# Patient Record
Sex: Female | Born: 1985 | Race: Black or African American | Hispanic: No | Marital: Married | Smoking: Never smoker
Health system: Southern US, Community
[De-identification: ages and names within clinical notes are randomized; demographics above are authoritative.]

## PROBLEM LIST (undated history)

## (undated) ENCOUNTER — Inpatient Hospital Stay (HOSPITAL_COMMUNITY): Payer: Self-pay

## (undated) DIAGNOSIS — R42 Dizziness and giddiness: Secondary | ICD-10-CM

## (undated) DIAGNOSIS — Z789 Other specified health status: Secondary | ICD-10-CM

## (undated) DIAGNOSIS — IMO0002 Reserved for concepts with insufficient information to code with codable children: Secondary | ICD-10-CM

## (undated) DIAGNOSIS — R87629 Unspecified abnormal cytological findings in specimens from vagina: Secondary | ICD-10-CM

## (undated) HISTORY — DX: Unspecified abnormal cytological findings in specimens from vagina: R87.629

## (undated) HISTORY — PX: COLPOSCOPY: SHX161

---

## 2007-07-04 ENCOUNTER — Ambulatory Visit (HOSPITAL_COMMUNITY): Admission: RE | Admit: 2007-07-04 | Discharge: 2007-07-04 | Payer: Self-pay | Admitting: Obstetrics & Gynecology

## 2007-07-08 ENCOUNTER — Ambulatory Visit (HOSPITAL_COMMUNITY): Admission: RE | Admit: 2007-07-08 | Discharge: 2007-07-08 | Payer: Self-pay | Admitting: Obstetrics & Gynecology

## 2007-07-22 ENCOUNTER — Ambulatory Visit (HOSPITAL_COMMUNITY): Admission: RE | Admit: 2007-07-22 | Discharge: 2007-07-22 | Payer: Self-pay | Admitting: Obstetrics & Gynecology

## 2007-08-21 ENCOUNTER — Ambulatory Visit (HOSPITAL_COMMUNITY): Admission: RE | Admit: 2007-08-21 | Discharge: 2007-08-21 | Payer: Self-pay | Admitting: Family Medicine

## 2007-10-04 ENCOUNTER — Ambulatory Visit: Payer: Self-pay | Admitting: Obstetrics and Gynecology

## 2007-10-04 ENCOUNTER — Inpatient Hospital Stay (HOSPITAL_COMMUNITY): Admission: AD | Admit: 2007-10-04 | Discharge: 2007-10-07 | Payer: Self-pay | Admitting: Obstetrics and Gynecology

## 2007-10-10 ENCOUNTER — Ambulatory Visit: Admission: RE | Admit: 2007-10-10 | Discharge: 2007-10-10 | Payer: Self-pay | Admitting: Family Medicine

## 2007-11-18 ENCOUNTER — Encounter: Admission: RE | Admit: 2007-11-18 | Discharge: 2007-11-18 | Payer: Self-pay | Admitting: Family Medicine

## 2009-07-20 ENCOUNTER — Ambulatory Visit (HOSPITAL_COMMUNITY): Admission: RE | Admit: 2009-07-20 | Discharge: 2009-07-20 | Payer: Self-pay | Admitting: Obstetrics & Gynecology

## 2009-07-30 DIAGNOSIS — R87619 Unspecified abnormal cytological findings in specimens from cervix uteri: Secondary | ICD-10-CM

## 2009-07-30 DIAGNOSIS — IMO0002 Reserved for concepts with insufficient information to code with codable children: Secondary | ICD-10-CM

## 2009-07-30 HISTORY — DX: Reserved for concepts with insufficient information to code with codable children: IMO0002

## 2009-07-30 HISTORY — DX: Unspecified abnormal cytological findings in specimens from cervix uteri: R87.619

## 2009-12-23 ENCOUNTER — Ambulatory Visit: Payer: Self-pay | Admitting: Obstetrics & Gynecology

## 2009-12-23 ENCOUNTER — Ambulatory Visit: Payer: Self-pay | Admitting: Physician Assistant

## 2009-12-23 ENCOUNTER — Inpatient Hospital Stay (HOSPITAL_COMMUNITY): Admission: AD | Admit: 2009-12-23 | Discharge: 2009-12-25 | Payer: Self-pay | Admitting: Family Medicine

## 2010-03-23 ENCOUNTER — Ambulatory Visit (HOSPITAL_COMMUNITY): Admission: RE | Admit: 2010-03-23 | Discharge: 2010-03-23 | Payer: Self-pay | Admitting: Cardiology

## 2010-10-16 LAB — CBC
HCT: 42 % (ref 36.0–46.0)
Hemoglobin: 13.6 g/dL (ref 12.0–15.0)
MCHC: 33.5 g/dL (ref 30.0–36.0)
MCHC: 33.7 g/dL (ref 30.0–36.0)
MCV: 91.7 fL (ref 78.0–100.0)
Platelets: 149 10*3/uL — ABNORMAL LOW (ref 150–400)
Platelets: 157 10*3/uL (ref 150–400)
RBC: 4.39 MIL/uL (ref 3.87–5.11)
RDW: 13.6 % (ref 11.5–15.5)
RDW: 13.7 % (ref 11.5–15.5)

## 2010-10-16 LAB — RH IMMUNE GLOB WKUP(>/=20WKS)(NOT WOMEN'S HOSP)

## 2010-10-16 LAB — RPR: RPR Ser Ql: NONREACTIVE

## 2011-04-23 LAB — RH IMMUNE GLOB WKUP(>/=20WKS)(NOT WOMEN'S HOSP): Fetal Screen: NEGATIVE

## 2011-04-23 LAB — CBC
HCT: 41.8
Hemoglobin: 14.3
MCHC: 34
MCHC: 34.3
Platelets: 183
RBC: 4.15
RBC: 4.67
RDW: 14
WBC: 12.7 — ABNORMAL HIGH
WBC: 8.7

## 2011-07-15 ENCOUNTER — Emergency Department (INDEPENDENT_AMBULATORY_CARE_PROVIDER_SITE_OTHER)
Admission: EM | Admit: 2011-07-15 | Discharge: 2011-07-15 | Disposition: A | Discharge status: Home / Self Care | Payer: Self-pay | Source: Home / Self Care | Attending: Family Medicine | Admitting: Family Medicine

## 2011-07-15 DIAGNOSIS — S0990XA Unspecified injury of head, initial encounter: Secondary | ICD-10-CM

## 2011-07-15 NOTE — ED Notes (Signed)
Pt was assisting pt to bathroom and the battery fell out of the hoyer lift and hit her in forehead at 1105 today,  Small eraser size spot on forehead.  Pain 1/1

## 2011-07-15 NOTE — ED Provider Notes (Signed)
History     CSN: [id number] Arrival date & time: 07/15/2011  2:40 PM   First MD Initiated Contact with Patient 07/15/11 1423      No chief complaint on file.   (Consider location/radiation/quality/duration/timing/severity/associated sxs/prior treatment) Patient is a 25 y.o. female presenting with head injury.  Head Injury  The incident occurred 3 to 5 hours ago. She came to the ER via walk-in. The injury mechanism was a direct blow (battery fell from hoyer lift and struck pt in forehead.). There was no loss of consciousness. There was no blood loss. The quality of the pain is described as dull. The pain is mild. Associated symptoms comments: none. She has tried nothing for the symptoms.    No past medical history on file.  No past surgical history on file.  No family history on file.  History  Substance Use Topics  . Smoking status: Not on file  . Smokeless tobacco: Not on file  . Alcohol Use: Not on file    OB History    No data available      Review of Systems  Constitutional: Negative.   Musculoskeletal: Negative.   Neurological: Negative.     Allergies  Review of patient's allergies indicates not on file.  Home Medications  No current outpatient prescriptions on file.  BP 101/56  Pulse 49  Temp(Src) 99 F (37.2 C) (Oral)  Resp 20  SpO2 99%  Physical Exam  Nursing note and vitals reviewed. Constitutional: She is oriented to person, place, and time. She appears well-developed and well-nourished.  HENT:  Head: Normocephalic. Head is with abrasion. Head is without raccoon's eyes, without contusion, without laceration, without right periorbital erythema and without left periorbital erythema.    Right Ear: External ear normal.  Left Ear: External ear normal.  Mouth/Throat: Oropharynx is clear and moist.  Eyes: Pupils are equal, round, and reactive to light.  Neck: Normal range of motion. Neck supple.  Neurological: She is alert and oriented to person,  place, and time.  Skin: Skin is warm and dry.    ED Course  Procedures (including critical care time)  Labs Reviewed - No data to display No results found.   1. Minor head injury       MDM          Barkley Bruns, MD 07/15/11 [phone]

## 2011-08-19 ENCOUNTER — Emergency Department (HOSPITAL_COMMUNITY)
Admission: EM | Admit: 2011-08-19 | Discharge: 2011-08-19 | Disposition: A | Discharge status: Home / Self Care | Payer: Medicaid Other | Attending: Emergency Medicine | Admitting: Emergency Medicine

## 2011-08-19 DIAGNOSIS — R000 Tachycardia, unspecified: Secondary | ICD-10-CM | POA: Insufficient documentation

## 2011-08-19 DIAGNOSIS — R112 Nausea with vomiting, unspecified: Secondary | ICD-10-CM | POA: Insufficient documentation

## 2011-08-19 DIAGNOSIS — R630 Anorexia: Secondary | ICD-10-CM | POA: Insufficient documentation

## 2011-08-19 DIAGNOSIS — J3489 Other specified disorders of nose and nasal sinuses: Secondary | ICD-10-CM | POA: Insufficient documentation

## 2011-08-19 DIAGNOSIS — R070 Pain in throat: Secondary | ICD-10-CM | POA: Insufficient documentation

## 2011-08-19 DIAGNOSIS — IMO0001 Reserved for inherently not codable concepts without codable children: Secondary | ICD-10-CM | POA: Insufficient documentation

## 2011-08-19 DIAGNOSIS — R509 Fever, unspecified: Secondary | ICD-10-CM | POA: Insufficient documentation

## 2011-08-19 DIAGNOSIS — R05 Cough: Secondary | ICD-10-CM | POA: Insufficient documentation

## 2011-08-19 DIAGNOSIS — R5381 Other malaise: Secondary | ICD-10-CM | POA: Insufficient documentation

## 2011-08-19 DIAGNOSIS — J111 Influenza due to unidentified influenza virus with other respiratory manifestations: Secondary | ICD-10-CM | POA: Insufficient documentation

## 2011-08-19 DIAGNOSIS — R059 Cough, unspecified: Secondary | ICD-10-CM | POA: Insufficient documentation

## 2011-08-19 DIAGNOSIS — H9209 Otalgia, unspecified ear: Secondary | ICD-10-CM | POA: Insufficient documentation

## 2011-08-19 MED ORDER — ONDANSETRON HCL 4 MG/2ML IJ SOLN
4.0000 mg | Freq: Once | INTRAMUSCULAR | Status: AC
  Administered 2011-08-19: 4 mg via INTRAVENOUS
  Filled 2011-08-19: qty 2

## 2011-08-19 MED ORDER — KETOROLAC TROMETHAMINE 30 MG/ML IJ SOLN
30.0000 mg | Freq: Once | INTRAMUSCULAR | Status: AC
  Administered 2011-08-19: 30 mg via INTRAVENOUS
  Filled 2011-08-19: qty 1

## 2011-08-19 MED ORDER — ONDANSETRON 8 MG PO TBDP: 8.0000 mg | ORAL_TABLET | Freq: Three times a day (TID) | ORAL | Status: AC | PRN

## 2011-08-19 MED ORDER — NAPROXEN SODIUM 220 MG PO TABS: ORAL_TABLET | ORAL | Status: DC

## 2011-08-19 MED ORDER — SODIUM CHLORIDE 0.9 % IV BOLUS (SEPSIS)
1000.0000 mL | Freq: Once | INTRAVENOUS | Status: AC
  Administered 2011-08-19: 1000 mL via INTRAVENOUS

## 2011-08-19 NOTE — ED Provider Notes (Signed)
History     CSN: [id number]  Arrival date & time 08/19/11  0449   First MD Initiated Contact with Patient 08/19/11 [phone]      Chief Complaint  Patient presents with  . Flu-like symptoms     (Consider location/radiation/quality/duration/timing/severity/associated sxs/prior treatment) HPI Is a 26 year old female with three-day history of fever, chills, myalgias, nausea, vomiting, cough, nasal congestion, sore throat, earache and malaise. She's had multiple family members and contacts with similar symptoms. She was not able to keeping her stomach yesterday but has been drinking water this morning without difficulty. She has not had diarrhea, dyspnea or abdominal pain. She's been taking over-the-counter cold medications without significant relief. She called EMS this morning because she felt worse. The symptoms are described as moderate to severe.  No past medical history on file.  No past surgical history on file.  No family history on file.  History  Substance Use Topics  . Smoking status: Never Smoker   . Smokeless tobacco: Not on file  . Alcohol Use: No    OB History    Grav Para Term Preterm Abortions TAB SAB Ect Mult Living                  Review of Systems  All other systems reviewed and are negative.    Allergies  Review of patient's allergies indicates no known allergies.  Home Medications  No current outpatient prescriptions on file.  BP 126/64  Pulse 110  Temp(Src) 100.3 F (37.9 C) (Oral)  Resp 20  SpO2 100%  Physical Exam General: Well-developed, well-nourished female in no acute distress; appearance consistent with age of record HENT: normocephalic, atraumatic; no pharyngeal erythema; slight pharyngeal edema Eyes: pupils equal round and reactive to light; extraocular muscles intact Neck: supple Heart: regular rate and rhythm; tachycardia Lungs: clear to auscultation bilaterally Abdomen: soft; nondistended; nontender; no masses or  hepatosplenomegaly; bowel sounds present Extremities: No deformity; full range of motion; pulses normal Neurologic: Awake, alert and oriented; motor function intact in all extremities and symmetric; no facial droop Skin: Warm and dry     ED Course  Procedures (including critical care time)     MDM  Patient feels significantly better after IV fluid bolus, IV Toradol and IV Zofran. She is drinking fluids without emesis.          Hanley Seamen, MD 08/19/11 [phone]

## 2011-08-19 NOTE — ED Notes (Signed)
Pt ambulated with a steady gait; VSS; no signs of distress; skin warm and dry; respirations even and unlabored; no questions at this time.

## 2011-08-19 NOTE — ED Notes (Signed)
PT reports feeling a lot better; drinking apple juice at this time.

## 2011-08-19 NOTE — ED Notes (Signed)
Pt reports nasal congestion and headache and earache.

## 2011-08-19 NOTE — ED Notes (Signed)
Flu like symptoms

## 2012-04-17 ENCOUNTER — Encounter (HOSPITAL_COMMUNITY): Payer: Self-pay | Admitting: *Deleted

## 2012-04-17 ENCOUNTER — Inpatient Hospital Stay (HOSPITAL_COMMUNITY)
Admission: AD | Admit: 2012-04-17 | Discharge: 2012-04-17 | Disposition: A | Discharge status: Home / Self Care | Payer: Medicaid Other | Source: Ambulatory Visit | Attending: Obstetrics and Gynecology | Admitting: Obstetrics and Gynecology

## 2012-04-17 DIAGNOSIS — R142 Eructation: Secondary | ICD-10-CM | POA: Insufficient documentation

## 2012-04-17 DIAGNOSIS — R141 Gas pain: Secondary | ICD-10-CM

## 2012-04-17 DIAGNOSIS — R143 Flatulence: Secondary | ICD-10-CM | POA: Insufficient documentation

## 2012-04-17 DIAGNOSIS — R109 Unspecified abdominal pain: Secondary | ICD-10-CM | POA: Insufficient documentation

## 2012-04-17 DIAGNOSIS — Z3490 Encounter for supervision of normal pregnancy, unspecified, unspecified trimester: Secondary | ICD-10-CM

## 2012-04-17 DIAGNOSIS — O99891 Other specified diseases and conditions complicating pregnancy: Secondary | ICD-10-CM | POA: Insufficient documentation

## 2012-04-17 HISTORY — DX: Reserved for concepts with insufficient information to code with codable children: IMO0002

## 2012-04-17 LAB — URINE MICROSCOPIC-ADD ON

## 2012-04-17 LAB — URINALYSIS, ROUTINE W REFLEX MICROSCOPIC
Nitrite: NEGATIVE
Urobilinogen, UA: 0.2 mg/dL (ref 0.0–1.0)

## 2012-04-17 LAB — POCT PREGNANCY, URINE: Preg Test, Ur: POSITIVE — AB

## 2012-04-17 MED ORDER — PRENATAL VITAMINS 0.8 MG PO TABS: 1.0000 | ORAL_TABLET | Freq: Every day | ORAL | Status: DC

## 2012-04-17 NOTE — MAU Note (Signed)
Left sided abd pain started at 1845-is less now than it was earlier

## 2012-04-17 NOTE — MAU Provider Note (Signed)
Chief Complaint:  Abdominal Pain   Erika Decker is  26 y.o. [id number].  Patient's last menstrual period was 01/30/2012.Marland Kitchen  Her pregnancy status is positive. [redacted]w[redacted]d  She presents complaining of Abdominal Pain  Pt reports single episode of sharp LUQ abd pain that lasted for ~ 20 mins. Resolved spontaneously. No complaints at presents. States no PNC. First appt with [facility] on 05/07/12.  Obstetrical/Gynecological History: OB History    Grav Para Term Preterm Abortions TAB SAB Ect Mult Living   3 2 2       2       Past Medical History: Past Medical History  Diagnosis Date  . Abnormal Pap smear     Past Surgical History: Past Surgical History  Procedure Date  . No past surgeries     Family History: History reviewed. No pertinent family history.  Social History: History  Substance Use Topics  . Smoking status: Never Smoker   . Smokeless tobacco: Never Used  . Alcohol Use: No    Allergies: No Known Allergies  No prescriptions prior to admission    Review of Systems - History obtained from the patient General ROS: negative for - chills, fatigue or fever Endocrine ROS: negative Breast ROS: positive tenderness Respiratory ROS: no cough, shortness of breath, or wheezing Cardiovascular ROS: no chest pain or dyspnea on exertion Gastrointestinal ROS: no abdominal pain, change in bowel habits, or black or bloody stools Genito-Urinary ROS: no dysuria, trouble voiding, or hematuria Musculoskeletal ROS: negative  Physical Exam   Blood pressure 109/66, pulse 81, temperature 97.3 F (36.3 C), temperature source Oral, resp. rate 20, height 5\' 2"  (1.575 m), weight 121 lb (54.885 kg), last menstrual period 01/30/2012, SpO2 100.00%.  General: General appearance - alert, well appearing, and in no distress, oriented to person, place, and time and normal appearing weight Abdomen - soft, nontender, nondistended, no masses or organomegaly no rebound tenderness noted bowel sounds normal no  bladder distension noted no CVA tenderness no hernias noted Focused Gynecological Exam: examination not indicated  Labs: Recent Results (from the past 24 hour(s))  POCT PREGNANCY, URINE   Collection Time   04/17/12  8:40 PM      Component Value Range   Preg Test, Ur POSITIVE (*) NEGATIVE   Imaging Studies:  Informal bedside: Viable IUP. +yolk sac, CRL measuring [redacted]w[redacted]d. + cardiac activty   Assessment: Gas Pain Viable IUP  Plan: Discharge home FU with [facility] as scheduled  Sarh Kirschenbaum E. 04/17/2012,9:08 PM

## 2012-04-18 NOTE — MAU Provider Note (Signed)
Attestation of Attending Supervision of Advanced Practitioner (CNM/NP): Evaluation and management procedures were performed by the Advanced Practitioner under my supervision and collaboration.  I have reviewed the Advanced Practitioner's note and chart, and I agree with the management and plan.  Bleu Minerd 04/18/2012 6:09 AM

## 2012-05-14 ENCOUNTER — Encounter (HOSPITAL_COMMUNITY): Payer: Self-pay | Admitting: *Deleted

## 2012-05-14 ENCOUNTER — Inpatient Hospital Stay (HOSPITAL_COMMUNITY)
Admission: AD | Admit: 2012-05-14 | Discharge: 2012-05-14 | Disposition: A | Discharge status: Home / Self Care | Payer: Medicaid Other | Source: Ambulatory Visit | Attending: Obstetrics & Gynecology | Admitting: Obstetrics & Gynecology

## 2012-05-14 DIAGNOSIS — O99891 Other specified diseases and conditions complicating pregnancy: Secondary | ICD-10-CM | POA: Insufficient documentation

## 2012-05-14 DIAGNOSIS — R0781 Pleurodynia: Secondary | ICD-10-CM

## 2012-05-14 DIAGNOSIS — R079 Chest pain, unspecified: Secondary | ICD-10-CM | POA: Insufficient documentation

## 2012-05-14 LAB — URINALYSIS, ROUTINE W REFLEX MICROSCOPIC
Bilirubin Urine: NEGATIVE
Glucose, UA: NEGATIVE mg/dL
Nitrite: NEGATIVE
Specific Gravity, Urine: 1.01 (ref 1.005–1.030)

## 2012-05-14 MED ORDER — ACETAMINOPHEN 500 MG PO TABS
1000.0000 mg | ORAL_TABLET | Freq: Once | ORAL | Status: AC
  Administered 2012-05-14: 1000 mg via ORAL
  Filled 2012-05-14: qty 2

## 2012-05-14 NOTE — MAU Provider Note (Signed)
  History     CSN: [id number]  Arrival date and time: 05/14/12 1220   First Provider Initiated Contact with Patient 05/14/12 1554      Chief Complaint  Patient presents with  . Chest Pain   HPI Erika Decker 26 y.o. [redacted]w[redacted]d  Comes to MAU with pain in her ribs when she moves or yawns.  Has not taken any medication.  Has not yet started prenatal care as she is awaiting Medicaid card.  No vomiting, no nausea, no vaginal bleeding, no vaginal discharge, no itching or burning.  OB History    Grav Para Term Preterm Abortions TAB SAB Ect Mult Living   3 2 2       2       Past Medical History  Diagnosis Date  . Abnormal Pap smear     Past Surgical History  Procedure Date  . No past surgeries     Family History  Problem Relation Age of Onset  . Diabetes Mother   . Hypertension Mother   . Mental illness Brother   . Asthma Brother     History  Substance Use Topics  . Smoking status: Never Smoker   . Smokeless tobacco: Never Used  . Alcohol Use: No    Allergies: No Known Allergies  Prescriptions prior to admission  Medication Sig Dispense Refill  . Prenatal Multivit-Min-Fe-FA (PRENATAL VITAMINS) 0.8 MG tablet Take 1 tablet by mouth daily.  30 tablet  10    Review of Systems  Constitutional: Negative for fever.  HENT: Negative for congestion.   Respiratory: Negative for cough and sputum production.   Cardiovascular:       Pain in ribs when moving  Gastrointestinal: Negative for nausea, vomiting, abdominal pain, diarrhea and constipation.  Genitourinary: Negative for dysuria.   Physical Exam   Blood pressure 108/62, pulse 84, temperature 98.7 F (37.1 C), temperature source Oral, resp. rate 18, height 5\' 1"  (1.549 m), weight 58.242 kg (128 lb 6.4 oz), last menstrual period 01/30/2012, SpO2 100.00%.  Physical Exam  Nursing note and vitals reviewed. Constitutional: She is oriented to person, place, and time. She appears well-developed and well-nourished.  HENT:    Head: Normocephalic.  Eyes: EOM are normal.  Neck: Neck supple.  Respiratory:       Pain with palpation in lower rib cage interiorly.  No pain in her sides or back.  GI: Soft. There is no tenderness.       FHT heard with doppler  Musculoskeletal: Normal range of motion.  Neurological: She is alert and oriented to person, place, and time.  Skin: Skin is warm and dry.  Psychiatric: She has a normal mood and affect.    MAU Course  Procedures  MDM Discussed possible causes of rib pain - changes in pregnancy or costochondiritis.  Advised tylenol PO for pain.  Advised it may take time to resolve or it may simply be a discomfort of pregnancy.  Assessment and Plan  Rib pain in pregnancy  Plan Take Tylenol 325 mg 2 tablets by mouth every 4 hours if needed for pain. Begin prenatal care as soon as possible.  BURLESON,TERRI 05/14/2012, 4:16 PM

## 2012-05-14 NOTE — MAU Note (Signed)
Pt states she started having pain last night. Pt describes pain as sharp pain "hurts every time I move"

## 2012-05-14 NOTE — MAU Note (Signed)
Patient states she started having rib pain that hurts with any type of movement. No cold symptoms or cough. Patient has not had prenatal care. Denies any bleeding or discharge.

## 2012-05-16 NOTE — MAU Provider Note (Signed)
Attestation of Attending Supervision of Advanced Practitioner (CNM/NP): Evaluation and management procedures were performed by the Advanced Practitioner under my supervision and collaboration.  I have reviewed the Advanced Practitioner's note and chart, and I agree with the management and plan.  [name], [name] 12:48 PM

## 2012-05-26 ENCOUNTER — Telehealth: Payer: Self-pay | Admitting: *Deleted

## 2012-05-26 NOTE — Telephone Encounter (Signed)
[employer], states she is having cold like symptoms like stuffy nose, sore throat. Discussed she may take Robitussin plain or DM, tylenol allergy or Delsym. She states she had a problem with her medicaid card and is getting that straightened out then will get prenatal care- states no one will take her until it is straightened out.

## 2012-05-26 NOTE — Telephone Encounter (Signed)
Erika Decker called and left a message stating she is calling to see what cough medicine she can take for a cold. States she is 4 months pregnant .( was seen in MAU, no prenatal appt. Noted)

## 2012-06-06 LAB — OB RESULTS CONSOLE GC/CHLAMYDIA: Chlamydia: NEGATIVE

## 2012-06-06 LAB — OB RESULTS CONSOLE RUBELLA ANTIBODY, IGM: Rubella: UNDETERMINED

## 2012-06-06 LAB — OB RESULTS CONSOLE HEPATITIS B SURFACE ANTIGEN: Hepatitis B Surface Ag: NEGATIVE

## 2012-07-25 ENCOUNTER — Encounter (HOSPITAL_COMMUNITY): Payer: Self-pay | Admitting: Obstetrics and Gynecology

## 2012-07-25 ENCOUNTER — Inpatient Hospital Stay (HOSPITAL_COMMUNITY)
Admission: AD | Admit: 2012-07-25 | Discharge: 2012-07-25 | Disposition: A | Discharge status: Home / Self Care | Payer: PRIVATE HEALTH INSURANCE | Source: Ambulatory Visit | Attending: Obstetrics & Gynecology | Admitting: Obstetrics & Gynecology

## 2012-07-25 DIAGNOSIS — O212 Late vomiting of pregnancy: Secondary | ICD-10-CM | POA: Insufficient documentation

## 2012-07-25 DIAGNOSIS — O99891 Other specified diseases and conditions complicating pregnancy: Secondary | ICD-10-CM

## 2012-07-25 DIAGNOSIS — R109 Unspecified abdominal pain: Secondary | ICD-10-CM | POA: Insufficient documentation

## 2012-07-25 DIAGNOSIS — K5289 Other specified noninfective gastroenteritis and colitis: Secondary | ICD-10-CM

## 2012-07-25 DIAGNOSIS — K529 Noninfective gastroenteritis and colitis, unspecified: Secondary | ICD-10-CM

## 2012-07-25 LAB — URINALYSIS, ROUTINE W REFLEX MICROSCOPIC
Bilirubin Urine: NEGATIVE
Nitrite: NEGATIVE
Specific Gravity, Urine: >1.03 — ABNORMAL HIGH (ref 1.005–1.030)
pH: 6 (ref 5.0–8.0)

## 2012-07-25 MED ORDER — ONDANSETRON 8 MG PO TBDP: 8.0000 mg | ORAL_TABLET | Freq: Three times a day (TID) | ORAL | Status: DC | PRN

## 2012-07-25 MED ORDER — ONDANSETRON HCL 4 MG PO TABS
8.0000 mg | ORAL_TABLET | Freq: Once | ORAL | Status: AC
  Administered 2012-07-25: 8 mg via ORAL
  Filled 2012-07-25: qty 2

## 2012-07-25 NOTE — MAU Provider Note (Signed)
  History     CSN: [id number]  Arrival date and time: 07/25/12 1231   None     Chief Complaint  Patient presents with  . Nausea  . Emesis   HPI  Pt is a G3P2002 at 25.2 wks IUP here with report of vomiting x one last night.  No vomiting reported today or diarrhea.  Denies fever, body aches or chills.  Recent exposure to son who had upper respiratory infection.  No report of vaginal bleeding or leaking of fluid.  Decreased eating and drinking today due to feeling "sick to my stomach".    Past Medical History  Diagnosis Date  . Abnormal Pap smear 2011    Colpo > paps nml after    Past Surgical History  Procedure Date  . No past surgeries     Family History  Problem Relation Age of Onset  . Diabetes Mother   . Hypertension Mother   . Mental illness Brother   . Asthma Brother     History  Substance Use Topics  . Smoking status: Never Smoker   . Smokeless tobacco: Never Used  . Alcohol Use: No    Allergies: No Known Allergies  Prescriptions prior to admission  Medication Sig Dispense Refill  . Prenatal Multivit-Min-Fe-FA (PRENATAL VITAMINS) 0.8 MG tablet Take 1 tablet by mouth daily.  30 tablet  10    Review of Systems  Gastrointestinal: Positive for nausea. Negative for vomiting and diarrhea.  All other systems reviewed and are negative.   Physical Exam   Blood pressure 106/53, pulse 116, temperature 98.7 F (37.1 C), resp. rate 18, height 5\' 1"  (1.549 m), weight 62.256 kg (137 lb 4 oz), last menstrual period 01/30/2012.  Physical Exam  Constitutional: She is oriented to person, place, and time. She appears well-developed and well-nourished. No distress.  HENT:  Head: Normocephalic.  Mouth/Throat: Mucous membranes are normal. Mucous membranes are not dry.  Neck: Normal range of motion. Neck supple.  Cardiovascular: Normal rate, regular rhythm and normal heart sounds.   Respiratory: Effort normal and breath sounds normal.  GI: Soft. There is no  tenderness.       Hyperactive bowel sounds  Genitourinary: No bleeding around the vagina.  Musculoskeletal: Normal range of motion. She exhibits no edema.  Neurological: She is alert and oriented to person, place, and time.  Skin: Skin is warm and dry.   Cervix - closed MAU Course  Procedures Results for orders placed during the hospital encounter of 07/25/12 (from the past 24 hour(s))  URINALYSIS, ROUTINE W REFLEX MICROSCOPIC     Status: Abnormal   Collection Time   07/25/12  1:01 PM      Component Value Range   Color, Urine YELLOW  YELLOW   APPearance CLEAR  CLEAR   Specific Gravity, Urine >1.030 (*) 1.005 - 1.030   pH 6.0  5.0 - 8.0   Glucose, UA NEGATIVE  NEGATIVE mg/dL   Hgb urine dipstick NEGATIVE  NEGATIVE   Bilirubin Urine NEGATIVE  NEGATIVE   Ketones, ur >80 (*) NEGATIVE mg/dL   Protein, ur NEGATIVE  NEGATIVE mg/dL   Urobilinogen, UA 0.2  0.0 - 1.0 mg/dL   Nitrite NEGATIVE  NEGATIVE   Leukocytes, UA NEGATIVE  NEGATIVE  FHR 140's, +accels (10x10), reactive Toco irritability  Zofran 8 mg PO Tolerated PO fluids without difficulty  Uterine irritability resolves  Assessment and Plan  Gastroenteritis  Plan: DC to home Encourage PO hydration  [facility] 07/25/2012, 1:31 PM

## 2012-07-25 NOTE — MAU Note (Signed)
Water and juice given to patient. Fluids encouraged.

## 2012-07-25 NOTE — MAU Note (Signed)
Pt presents to MAU with chief complaint of abdominal cramping. Pt says around 9:00 pm last night she felt her abdomen start to cramp. She vomited 1 time and has had NORMAL stool since then. Pt says she feels hot/cold on and off. Denies fever. Denies vaginal bleeding. Pt is [redacted]w[redacted]d

## 2012-07-30 NOTE — L&D Delivery Note (Signed)
Delivery Note At 2:01 PM a viable female was delivered via Vaginal, Spontaneous Delivery (Presentation: ; Occiput Anterior).  APGAR: 8, 9; weight .   Placenta status: Intact, Spontaneous.  Cord: 3 vessels with the following complications: None.    Anesthesia: None  Episiotomy: None Lacerations: 2nd degree;Perineal Suture Repair: 2.0 vicryl rapide Est. Blood Loss (mL): 200 ml  Mom to postpartum.  Baby to nursery-stable.  JACKSON-MOORE,Erubiel Manasco A 11/09/2012, 2:27 PM

## 2012-08-07 MED ORDER — RHO D IMMUNE GLOBULIN 1500 UNIT/2ML IJ SOLN: 300.0000 ug | Freq: Once | INTRAMUSCULAR | Status: DC

## 2012-08-15 ENCOUNTER — Inpatient Hospital Stay (HOSPITAL_COMMUNITY)
Admission: AD | Admit: 2012-08-15 | Discharge: 2012-08-15 | Disposition: A | Discharge status: Home / Self Care | Payer: Medicaid Other | Source: Ambulatory Visit | Attending: Obstetrics & Gynecology | Admitting: Obstetrics & Gynecology

## 2012-08-15 DIAGNOSIS — Z2989 Encounter for other specified prophylactic measures: Secondary | ICD-10-CM | POA: Insufficient documentation

## 2012-08-15 DIAGNOSIS — Z298 Encounter for other specified prophylactic measures: Secondary | ICD-10-CM | POA: Insufficient documentation

## 2012-08-15 LAB — ABO/RH: ABO/RH(D): A NEG

## 2012-08-15 MED ORDER — RHO D IMMUNE GLOBULIN 1500 UNIT/2ML IJ SOLN
300.0000 ug | INTRAMUSCULAR | Status: AC
  Administered 2012-08-15: 300 ug via INTRAMUSCULAR
  Filled 2012-08-15: qty 2

## 2012-08-16 LAB — RH IG WORKUP (INCLUDES ABO/RH)
ABO/RH(D): A NEG
Antibody Screen: NEGATIVE
Gestational Age(Wks): 28

## 2012-09-11 ENCOUNTER — Encounter (HOSPITAL_COMMUNITY): Payer: Self-pay | Admitting: *Deleted

## 2012-09-11 ENCOUNTER — Inpatient Hospital Stay (HOSPITAL_COMMUNITY)
Admission: AD | Admit: 2012-09-11 | Discharge: 2012-09-11 | Disposition: A | Discharge status: Home / Self Care | Payer: Medicaid Other | Source: Ambulatory Visit | Attending: Obstetrics | Admitting: Obstetrics

## 2012-09-11 DIAGNOSIS — O4700 False labor before 37 completed weeks of gestation, unspecified trimester: Secondary | ICD-10-CM | POA: Insufficient documentation

## 2012-09-11 DIAGNOSIS — K5289 Other specified noninfective gastroenteritis and colitis: Secondary | ICD-10-CM | POA: Insufficient documentation

## 2012-09-11 DIAGNOSIS — O99891 Other specified diseases and conditions complicating pregnancy: Secondary | ICD-10-CM | POA: Insufficient documentation

## 2012-09-11 DIAGNOSIS — K529 Noninfective gastroenteritis and colitis, unspecified: Secondary | ICD-10-CM

## 2012-09-11 HISTORY — DX: Dizziness and giddiness: R42

## 2012-09-11 LAB — URINE MICROSCOPIC-ADD ON

## 2012-09-11 LAB — URINALYSIS, ROUTINE W REFLEX MICROSCOPIC
Bilirubin Urine: NEGATIVE
Nitrite: NEGATIVE
Specific Gravity, Urine: >1.03 — ABNORMAL HIGH (ref 1.005–1.030)
Urobilinogen, UA: 1 mg/dL (ref 0.0–1.0)
pH: 6 (ref 5.0–8.0)

## 2012-09-11 LAB — WET PREP, GENITAL

## 2012-09-11 MED ORDER — ONDANSETRON 8 MG/NS 50 ML IVPB
8.0000 mg | Freq: Once | INTRAVENOUS | Status: AC
  Administered 2012-09-11: 8 mg via INTRAVENOUS
  Filled 2012-09-11: qty 8

## 2012-09-11 MED ORDER — LACTATED RINGERS IV BOLUS (SEPSIS)
1000.0000 mL | Freq: Once | INTRAVENOUS | Status: AC
  Administered 2012-09-11: 1000 mL via INTRAVENOUS

## 2012-09-11 MED ORDER — ONDANSETRON HCL 4 MG PO TABS: 4.0000 mg | ORAL_TABLET | Freq: Every day | ORAL | Status: DC | PRN

## 2012-09-11 MED ORDER — BETAMETHASONE SOD PHOS & ACET 6 (3-3) MG/ML IJ SUSP
12.0000 mg | Freq: Once | INTRAMUSCULAR | Status: AC
  Administered 2012-09-11: 12 mg via INTRAMUSCULAR
  Filled 2012-09-11: qty 2

## 2012-09-11 NOTE — MAU Note (Addendum)
Arrived via EMS. States has been having contractions since yesterday and came in today because UC's have continued. States she had N/V yesterday with more BM's than usual. Thought she may have "that virus." States she feels a little dizzy with some nausea today, but has been able to drink a lot of water. Denies fever or chills.

## 2012-09-11 NOTE — MAU Provider Note (Signed)
Chief Complaint:  Contractions  First Provider Initiated Contact with Patient 09/11/12 1209     HPI: Erika Decker is a 27 y.o. G3P2002 at [redacted]w[redacted]d who presents to maternity admissions by EMS reporting contractions, N/V and loose stools since yesterday. Able to keep down fluids. Vomited ~3 times. Last IC ~ 1 week ago.   Past Medical History: Past Medical History  Diagnosis Date  . Abnormal Pap smear 2011    Colpo > paps nml after  . Dizziness     Past obstetric history: OB History   Grav Para Term Preterm Abortions TAB SAB Ect Mult Living   3 2 2       2      # Outc Date GA Lbr Len/2nd Wgt Sex Del Anes PTL Lv   1 TRM 3/09 [redacted]w[redacted]d  4.540JW(1XB1YN) M SVD EPI  Yes   Comments: No complications   2 TRM 5/11 [redacted]w[redacted]d  8.295AO(1HY8MV) F SVD None  Yes   Comments: No complications   3 CUR               Past Surgical History: Past Surgical History  Procedure Laterality Date  . Colposcopy      Family History: Family History  Problem Relation Age of Onset  . Diabetes Mother   . Hypertension Mother   . Mental illness Brother   . Asthma Brother     Social History: History  Substance Use Topics  . Smoking status: Never Smoker   . Smokeless tobacco: Never Used  . Alcohol Use: No    Allergies: No Known Allergies  Meds:  Prescriptions prior to admission  Medication Sig Dispense Refill  . Prenatal Vit-Fe Fumarate-FA (PRENATAL MULTIVITAMIN) TABS Take 1 tablet by mouth daily.        ROS: Denies fever, chills, VB, LOF, sick contacts,   Physical Exam  Blood pressure 108/56, pulse 121, temperature 98.1 F (36.7 C), temperature source Oral, resp. rate 20, height 5\' 1"  (1.549 m), weight 65.772 kg (145 lb), last menstrual period 01/30/2012, SpO2 99.00%. Patient Vitals for the past 24 hrs:  BP Temp Temp src Pulse Resp SpO2 Height Weight  09/11/12 1537 108/56 mmHg - - 121 - - - -  09/11/12 1351 114/70 mmHg - - 135 - - - -  09/11/12 1226 112/62 mmHg - - 116 - - - -  09/11/12 1202 - - -  126 - 99 % - -  09/11/12 1157 - - - 110 - 98 % - -  09/11/12 1152 - - - 139 - 99 % - -  09/11/12 1151 103/58 mmHg 98.1 F (36.7 C) Oral 133 20 - 5\' 1"  (1.549 m) 65.772 kg (145 lb)    GENERAL: Well-developed, well-nourished female in no acute distress.  HEENT: normocephalic HEART: normal rate RESP: normal effort ABDOMEN: Soft, non-tender, gravid appropriate for gestational age EXTREMITIES: Nontender, no edema NEURO: alert and oriented SPECULUM EXAM: NEFG, physiologic discharge, no blood, cervix clean Dilation: 2 Effacement (%): Thick Cervical Position: Posterior Station: -2 Presentation: Vertex Exam by:: Ivonne Andrew, CNM  FHT:  Baseline 130 , moderate variability, accelerations present, no decelerations Contractions: q 9-12 mins, mild   Labs: Results for orders placed during the hospital encounter of 09/11/12 (from the past 24 hour(s))  URINALYSIS, ROUTINE W REFLEX MICROSCOPIC     Status: Abnormal   Collection Time    09/11/12 11:50 AM      Result Value Range   Color, Urine YELLOW  YELLOW   APPearance HAZY (*)  CLEAR   Specific Gravity, Urine >1.030 (*) 1.005 - 1.030   pH 6.0  5.0 - 8.0   Glucose, UA NEGATIVE  NEGATIVE mg/dL   Hgb urine dipstick NEGATIVE  NEGATIVE   Bilirubin Urine NEGATIVE  NEGATIVE   Ketones, ur >80 (*) NEGATIVE mg/dL   Protein, ur NEGATIVE  NEGATIVE mg/dL   Urobilinogen, UA 1.0  0.0 - 1.0 mg/dL   Nitrite NEGATIVE  NEGATIVE   Leukocytes, UA TRACE (*) NEGATIVE  URINE MICROSCOPIC-ADD ON     Status: Abnormal   Collection Time    09/11/12 11:50 AM      Result Value Range   Squamous Epithelial / LPF MANY (*) RARE   WBC, UA 3-6  <3 WBC/hpf   Bacteria, UA RARE  RARE   Urine-Other MUCOUS PRESENT    FETAL FIBRONECTIN     Status: Abnormal   Collection Time    09/11/12 12:39 PM      Result Value Range   Fetal Fibronectin POSITIVE (*) NEGATIVE  WET PREP, GENITAL     Status: Abnormal   Collection Time    09/11/12  1:45 PM      Result Value Range   Yeast  Wet Prep HPF POC NONE SEEN  NONE SEEN   Trich, Wet Prep NONE SEEN  NONE SEEN   Clue Cells Wet Prep HPF POC NONE SEEN  NONE SEEN   WBC, Wet Prep HPF POC FEW (*) NONE SEEN   Imaging:  No results found. MAU Course: BMZ # 1 given. IV fluids, Zofran. Tolerating PO's. Contractions resolved.   Assessment: 1. Preterm labor without delivery in third trimester   2. Gastroenteritis, acute    Plan: Discharge home Preterm labor precautions and fetal kick counts. Pelvic rest x 1 week.  Increase rest and fluids.  Advance diet slowly.     Follow-up Information   Schedule an appointment as soon as possible for a visit with HARPER,CHARLES A, MD.   Contact information:   [address] [place] [address] [phone]       Follow up with [facility] [place] [facility]. (in 24 hours) for BMZ #2.   Contact information:   [address] [place] [address] [phone]       Medication List    TAKE these medications       ondansetron 4 MG tablet  Commonly known as:  ZOFRAN  Take 1 tablet (4 mg total) by mouth daily as needed for nausea.     prenatal multivitamin Tabs  Take 1 tablet by mouth daily.        [place], [place] 09/11/2012 3:49 PM

## 2012-09-12 ENCOUNTER — Inpatient Hospital Stay (HOSPITAL_COMMUNITY)
Admission: AD | Admit: 2012-09-12 | Discharge: 2012-09-12 | Disposition: A | Discharge status: Home / Self Care | Payer: Medicaid Other | Source: Ambulatory Visit | Attending: Obstetrics | Admitting: Obstetrics

## 2012-09-12 ENCOUNTER — Encounter (HOSPITAL_COMMUNITY): Payer: Self-pay | Admitting: *Deleted

## 2012-09-12 DIAGNOSIS — O4700 False labor before 37 completed weeks of gestation, unspecified trimester: Secondary | ICD-10-CM | POA: Insufficient documentation

## 2012-09-12 MED ORDER — NIFEDIPINE ER OSMOTIC RELEASE 30 MG PO TB24
60.0000 mg | ORAL_TABLET | Freq: Once | ORAL | Status: AC
  Administered 2012-09-12: 60 mg via ORAL
  Filled 2012-09-12: qty 2

## 2012-09-12 MED ORDER — NIFEDIPINE ER OSMOTIC RELEASE 60 MG PO TB24: 60.0000 mg | ORAL_TABLET | Freq: Every day | ORAL | Status: DC

## 2012-09-12 MED ORDER — BETAMETHASONE SOD PHOS & ACET 6 (3-3) MG/ML IJ SUSP
12.0000 mg | Freq: Once | INTRAMUSCULAR | Status: AC
  Administered 2012-09-12: 12 mg via INTRAMUSCULAR
  Filled 2012-09-12: qty 2

## 2012-09-12 NOTE — MAU Provider Note (Signed)
  History     CSN: [id number]  Arrival date and time: 09/12/12 1520   First Provider Initiated Contact with Patient 09/12/12 1602      Chief Complaint  Patient presents with  . F/U betameth, cervix check, EFM    HPI 27 y.o. G3P2002 at [redacted]w[redacted]d. Here for 2nd dose of BMZ and cervix recheck. Seen yesterday with n/v, contractions, dehydration - FFN positive at that time, irregular contractions, SVE: 2/thick.     Past Medical History  Diagnosis Date  . Abnormal Pap smear 2011    Colpo > paps nml after  . Dizziness     Past Surgical History  Procedure Laterality Date  . Colposcopy      Family History  Problem Relation Age of Onset  . Diabetes Mother   . Hypertension Mother   . Mental illness Brother   . Asthma Brother     History  Substance Use Topics  . Smoking status: Never Smoker   . Smokeless tobacco: Never Used  . Alcohol Use: No    Allergies: No Known Allergies  Prescriptions prior to admission  Medication Sig Dispense Refill  . ondansetron (ZOFRAN) 4 MG tablet Take 1 tablet (4 mg total) by mouth daily as needed for nausea.  30 tablet  1  . Prenatal Vit-Fe Fumarate-FA (PRENATAL MULTIVITAMIN) TABS Take 1 tablet by mouth daily.        Review of Systems  Constitutional: Negative.   Respiratory: Negative.   Cardiovascular: Negative.   Gastrointestinal: Negative for nausea, vomiting, abdominal pain, diarrhea and constipation.  Genitourinary: Negative for dysuria, urgency, frequency, hematuria and flank pain.       Negative for vaginal bleeding, + irregular cramping/contractions  Musculoskeletal: Negative.   Neurological: Negative.   Psychiatric/Behavioral: Negative.    Physical Exam   Blood pressure 98/59, pulse 106, temperature 97.1 F (36.2 C), temperature source Oral, resp. rate 18, last menstrual period 01/30/2012.  Physical Exam  Nursing note and vitals reviewed. Constitutional: She is oriented to person, place, and time. She appears well-developed  and well-nourished. No distress.  Cardiovascular: Normal rate.   Respiratory: Effort normal.  GI: Soft. There is no tenderness.  Genitourinary: No vaginal discharge found.  Dilation: 3 Effacement (%): 20 Cervical Position: Posterior Exam by:: Telford Nab, CNM   Musculoskeletal: Normal range of motion.  Neurological: She is alert and oriented to person, place, and time.  Skin: Skin is warm and dry.  Psychiatric: She has a normal mood and affect.   EFM reactive, TOCO: irregular MAU Course  Procedures 2nd dose of BMZ given today Procardia XL 60 mg given prior to d/c  Assessment and Plan  26 y.o. [id number] at [redacted]w[redacted]d Threatened preterm labor - + FFN yesterday, irregular UCs, small cervical change Rev'd with Dr. Gaynell Face - rec. rx Procardia XL 60 mg PO qday, call [facility] Monday morning for f/u Return with signs of PTL  Mykal Kirchman 09/12/2012, 4:21 PM

## 2012-09-12 NOTE — MAU Note (Signed)
+  FFN yesterday. Cervix was 2cm/thick, not contracting. Was instr.'d to return for second does betamethasone, EFM and cervix re-check.

## 2012-10-15 ENCOUNTER — Ambulatory Visit (INDEPENDENT_AMBULATORY_CARE_PROVIDER_SITE_OTHER): Payer: Medicaid Other | Admitting: Obstetrics

## 2012-10-15 ENCOUNTER — Encounter: Payer: Self-pay | Admitting: Obstetrics

## 2012-10-15 DIAGNOSIS — Z3480 Encounter for supervision of other normal pregnancy, unspecified trimester: Secondary | ICD-10-CM

## 2012-10-15 DIAGNOSIS — Z3483 Encounter for supervision of other normal pregnancy, third trimester: Secondary | ICD-10-CM

## 2012-10-15 LAB — POCT URINALYSIS DIPSTICK
Bilirubin, UA: NEGATIVE
Glucose, UA: 100
Ketones, UA: NEGATIVE
Leukocytes, UA: NEGATIVE
Nitrite, UA: NEGATIVE
pH, UA: 5

## 2012-10-15 NOTE — Addendum Note (Signed)
Addended by: Glendell Docker on: 10/15/2012 09:36 AM   Modules accepted: Orders

## 2012-10-22 ENCOUNTER — Encounter: Payer: Self-pay | Admitting: Obstetrics

## 2012-10-22 ENCOUNTER — Ambulatory Visit (INDEPENDENT_AMBULATORY_CARE_PROVIDER_SITE_OTHER): Payer: Medicaid Other | Admitting: Obstetrics

## 2012-10-22 DIAGNOSIS — Z3480 Encounter for supervision of other normal pregnancy, unspecified trimester: Secondary | ICD-10-CM

## 2012-10-22 DIAGNOSIS — Z3483 Encounter for supervision of other normal pregnancy, third trimester: Secondary | ICD-10-CM

## 2012-11-06 ENCOUNTER — Encounter: Payer: Medicaid Other | Admitting: Obstetrics

## 2012-11-09 ENCOUNTER — Inpatient Hospital Stay (HOSPITAL_COMMUNITY)
Admission: AD | Admit: 2012-11-09 | Discharge: 2012-11-11 | DRG: 775 | Disposition: A | Discharge status: Home / Self Care | Payer: Medicaid Other | Source: Ambulatory Visit | Attending: Obstetrics & Gynecology | Admitting: Obstetrics & Gynecology

## 2012-11-09 ENCOUNTER — Encounter (HOSPITAL_COMMUNITY): Payer: Self-pay | Admitting: *Deleted

## 2012-11-09 DIAGNOSIS — Z22330 Carrier of Group B streptococcus: Secondary | ICD-10-CM

## 2012-11-09 DIAGNOSIS — IMO0001 Reserved for inherently not codable concepts without codable children: Secondary | ICD-10-CM

## 2012-11-09 DIAGNOSIS — O99892 Other specified diseases and conditions complicating childbirth: Secondary | ICD-10-CM | POA: Diagnosis present

## 2012-11-09 LAB — URINALYSIS, ROUTINE W REFLEX MICROSCOPIC
Bilirubin Urine: NEGATIVE
Nitrite: NEGATIVE
Specific Gravity, Urine: 1.015 (ref 1.005–1.030)
Urobilinogen, UA: 0.2 mg/dL (ref 0.0–1.0)
pH: 6 (ref 5.0–8.0)

## 2012-11-09 LAB — CBC
HCT: 39.1 % (ref 36.0–46.0)
MCV: 86.5 fL (ref 78.0–100.0)
RBC: 4.52 MIL/uL (ref 3.87–5.11)
RDW: 14 % (ref 11.5–15.5)
WBC: 8.4 10*3/uL (ref 4.0–10.5)

## 2012-11-09 LAB — URINE MICROSCOPIC-ADD ON

## 2012-11-09 MED ORDER — WITCH HAZEL-GLYCERIN EX PADS: 1.0000 "application " | MEDICATED_PAD | CUTANEOUS | Status: DC | PRN

## 2012-11-09 MED ORDER — DIBUCAINE 1 % RE OINT: 1.0000 "application " | TOPICAL_OINTMENT | RECTAL | Status: DC | PRN

## 2012-11-09 MED ORDER — OXYTOCIN 40 UNITS IN LACTATED RINGERS INFUSION - SIMPLE MED
62.5000 mL/h | INTRAVENOUS | Status: DC
Start: 2012-11-09 — End: 2012-11-09
  Administered 2012-11-09: 999 mL/h via INTRAVENOUS
  Filled 2012-11-09: qty 1000

## 2012-11-09 MED ORDER — MEASLES, MUMPS & RUBELLA VAC ~~LOC~~ INJ
0.5000 mL | INJECTION | Freq: Once | SUBCUTANEOUS | Status: DC
  Filled 2012-11-09: qty 0.5

## 2012-11-09 MED ORDER — NALOXONE HCL 0.4 MG/ML IJ SOLN
INTRAMUSCULAR | Status: AC
  Filled 2012-11-09: qty 1

## 2012-11-09 MED ORDER — ACETAMINOPHEN 325 MG PO TABS: 650.0000 mg | ORAL_TABLET | ORAL | Status: DC | PRN

## 2012-11-09 MED ORDER — LANOLIN HYDROUS EX OINT: TOPICAL_OINTMENT | CUTANEOUS | Status: DC | PRN

## 2012-11-09 MED ORDER — ONDANSETRON HCL 4 MG PO TABS: 4.0000 mg | ORAL_TABLET | ORAL | Status: DC | PRN

## 2012-11-09 MED ORDER — FERROUS SULFATE 325 (65 FE) MG PO TABS
325.0000 mg | ORAL_TABLET | Freq: Two times a day (BID) | ORAL | Status: DC
  Administered 2012-11-10 – 2012-11-11 (×3): 325 mg via ORAL
  Filled 2012-11-09 (×3): qty 1

## 2012-11-09 MED ORDER — EPHEDRINE 5 MG/ML INJ
10.0000 mg | INTRAVENOUS | Status: DC | PRN
  Filled 2012-11-09: qty 2

## 2012-11-09 MED ORDER — BENZOCAINE-MENTHOL 20-0.5 % EX AERO
1.0000 "application " | INHALATION_SPRAY | CUTANEOUS | Status: DC | PRN
  Administered 2012-11-11: 1 via TOPICAL
  Filled 2012-11-09: qty 56

## 2012-11-09 MED ORDER — ONDANSETRON HCL 4 MG/2ML IJ SOLN: 4.0000 mg | Freq: Four times a day (QID) | INTRAMUSCULAR | Status: DC | PRN

## 2012-11-09 MED ORDER — PENICILLIN G POTASSIUM 5000000 UNITS IJ SOLR
5.0000 10*6.[IU] | Freq: Once | INTRAVENOUS | Status: AC
  Administered 2012-11-09: 5 10*6.[IU] via INTRAVENOUS
  Filled 2012-11-09: qty 5

## 2012-11-09 MED ORDER — FLEET ENEMA 7-19 GM/118ML RE ENEM: 1.0000 | ENEMA | Freq: Once | RECTAL | Status: DC

## 2012-11-09 MED ORDER — PENICILLIN G POTASSIUM 5000000 UNITS IJ SOLR
2.5000 10*6.[IU] | INTRAVENOUS | Status: DC
  Filled 2012-11-09 (×4): qty 2.5

## 2012-11-09 MED ORDER — TETANUS-DIPHTH-ACELL PERTUSSIS 5-2.5-18.5 LF-MCG/0.5 IM SUSP
0.5000 mL | Freq: Once | INTRAMUSCULAR | Status: AC
  Administered 2012-11-10: 0.5 mL via INTRAMUSCULAR
  Filled 2012-11-09: qty 0.5

## 2012-11-09 MED ORDER — PRENATAL MULTIVITAMIN CH
1.0000 | ORAL_TABLET | Freq: Every day | ORAL | Status: DC
  Administered 2012-11-10 – 2012-11-11 (×2): 1 via ORAL
  Filled 2012-11-09 (×2): qty 1

## 2012-11-09 MED ORDER — IBUPROFEN 600 MG PO TABS: 600.0000 mg | ORAL_TABLET | Freq: Four times a day (QID) | ORAL | Status: DC | PRN

## 2012-11-09 MED ORDER — LACTATED RINGERS IV SOLN: 500.0000 mL | INTRAVENOUS | Status: DC | PRN

## 2012-11-09 MED ORDER — OXYTOCIN BOLUS FROM INFUSION: 500.0000 mL | INTRAVENOUS | Status: DC

## 2012-11-09 MED ORDER — IBUPROFEN 600 MG PO TABS
600.0000 mg | ORAL_TABLET | Freq: Four times a day (QID) | ORAL | Status: DC
  Administered 2012-11-09 – 2012-11-11 (×7): 600 mg via ORAL
  Filled 2012-11-09 (×8): qty 1

## 2012-11-09 MED ORDER — MAGNESIUM HYDROXIDE 400 MG/5ML PO SUSP: 30.0000 mL | ORAL | Status: DC | PRN

## 2012-11-09 MED ORDER — MISOPROSTOL 50MCG HALF TABLET
75.0000 ug | ORAL_TABLET | ORAL | Status: DC
  Filled 2012-11-09 (×2): qty 1

## 2012-11-09 MED ORDER — LIDOCAINE HCL (PF) 1 % IJ SOLN
30.0000 mL | INTRAMUSCULAR | Status: AC | PRN
  Administered 2012-11-09: 30 mL via SUBCUTANEOUS
  Filled 2012-11-09 (×2): qty 30

## 2012-11-09 MED ORDER — LACTATED RINGERS IV SOLN: 500.0000 mL | Freq: Once | INTRAVENOUS | Status: DC

## 2012-11-09 MED ORDER — PHENYLEPHRINE 40 MCG/ML (10ML) SYRINGE FOR IV PUSH (FOR BLOOD PRESSURE SUPPORT)
80.0000 ug | PREFILLED_SYRINGE | INTRAVENOUS | Status: DC | PRN
  Filled 2012-11-09: qty 2

## 2012-11-09 MED ORDER — SENNOSIDES-DOCUSATE SODIUM 8.6-50 MG PO TABS
2.0000 | ORAL_TABLET | Freq: Every day | ORAL | Status: DC
  Administered 2012-11-09 – 2012-11-10 (×2): 2 via ORAL

## 2012-11-09 MED ORDER — MEDROXYPROGESTERONE ACETATE 150 MG/ML IM SUSP: 150.0000 mg | INTRAMUSCULAR | Status: DC | PRN

## 2012-11-09 MED ORDER — BUTORPHANOL TARTRATE 1 MG/ML IJ SOLN
2.0000 mg | INTRAMUSCULAR | Status: DC | PRN
  Administered 2012-11-09 (×2): 2 mg via INTRAVENOUS
  Filled 2012-11-09 (×2): qty 2

## 2012-11-09 MED ORDER — DIPHENHYDRAMINE HCL 25 MG PO CAPS: 25.0000 mg | ORAL_CAPSULE | Freq: Four times a day (QID) | ORAL | Status: DC | PRN

## 2012-11-09 MED ORDER — LACTATED RINGERS IV SOLN
INTRAVENOUS | Status: DC
  Administered 2012-11-09: 10:00:00 via INTRAVENOUS

## 2012-11-09 MED ORDER — FENTANYL 2.5 MCG/ML BUPIVACAINE 1/10 % EPIDURAL INFUSION (WH - ANES): 14.0000 mL/h | INTRAMUSCULAR | Status: DC | PRN

## 2012-11-09 MED ORDER — DIPHENHYDRAMINE HCL 50 MG/ML IJ SOLN: 12.5000 mg | INTRAMUSCULAR | Status: DC | PRN

## 2012-11-09 MED ORDER — ZOLPIDEM TARTRATE 5 MG PO TABS: 5.0000 mg | ORAL_TABLET | Freq: Every evening | ORAL | Status: DC | PRN

## 2012-11-09 MED ORDER — CITRIC ACID-SODIUM CITRATE 334-500 MG/5ML PO SOLN: 30.0000 mL | ORAL | Status: DC | PRN

## 2012-11-09 MED ORDER — OXYCODONE-ACETAMINOPHEN 5-325 MG PO TABS
1.0000 | ORAL_TABLET | ORAL | Status: DC | PRN
Start: 2012-11-09 — End: 2012-11-11
  Administered 2012-11-10 – 2012-11-11 (×2): 1 via ORAL
  Filled 2012-11-09 (×2): qty 1

## 2012-11-09 MED ORDER — OXYCODONE-ACETAMINOPHEN 5-325 MG PO TABS: 1.0000 | ORAL_TABLET | ORAL | Status: DC | PRN

## 2012-11-09 MED ORDER — ONDANSETRON HCL 4 MG/2ML IJ SOLN: 4.0000 mg | INTRAMUSCULAR | Status: DC | PRN

## 2012-11-09 NOTE — MAU Note (Signed)
Pt presents with complaints of contractions that started a couple of days ago but has gotten progressively worse throughout the night. Denies LOF or bleeding

## 2012-11-09 NOTE — H&P (Signed)
Erika Decker is a 27 y.o. female presenting for contractions. Maternal Medical History:  Reason for admission: Contractions.   Contractions: Onset was 6-12 hours ago.   Frequency: regular.   Perceived severity is strong.    Fetal activity: Perceived fetal activity is normal.    Prenatal complications: no prenatal complications Prenatal Complications - Diabetes: none.    OB History   Grav Para Term Preterm Abortions TAB SAB Ect Mult Living   3 2 2       2      Past Medical History  Diagnosis Date  . Abnormal Pap smear 2011    Colpo > paps nml after  . Dizziness    Past Surgical History  Procedure Laterality Date  . Colposcopy     Family History: family history includes Asthma in her brother; Diabetes in her mother; Hypertension in her mother; and Mental illness in her brother. Social History:  reports that she has never smoked. She has never used smokeless tobacco. She reports that she does not drink alcohol or use illicit drugs.     Review of Systems  Constitutional: Negative for fever.  Eyes: Negative for blurred vision.  Respiratory: Negative for shortness of breath.   Gastrointestinal: Negative for vomiting.  Skin: Negative for rash.  Neurological: Negative for headaches.    Dilation: 5 Effacement (%): 100 Station: -2 Exam by:: Ginger Morris RN Blood pressure 103/49, pulse 62, temperature 98.5 F (36.9 C), temperature source Oral, resp. rate 16, height 5\' 2"  (1.575 m), weight 68.947 kg (152 lb), last menstrual period 01/30/2012. Maternal Exam:  Uterine Assessment: Contraction frequency is regular.   Abdomen: Fetal presentation: vertex  Introitus: not evaluated.   Cervix: Cervix evaluated by digital exam.     Physical Exam  Constitutional: She appears well-developed.  HENT:  Head: Normocephalic.  Neck: Neck supple. No thyromegaly present.  Cardiovascular: Normal rate and regular rhythm.   Respiratory: Breath sounds normal.  GI: Soft. Bowel sounds  are normal.  Skin: No rash noted.    Prenatal labs: ABO, Rh: --/--/A NEG, A NEG (01/17 1150) Antibody: NEG (01/17 1150) Rubella: Equivocal (11/08 0000) RPR: Nonreactive (11/08 0000)  HBsAg: Negative (11/08 0000)  HIV: Non-reactive (11/08 0000)  GBS: Positive (03/08 0000)    Assessment/Plan: Multipara at term, active labor, Category 1 FHT Admit, anticipate an NSVD   JACKSON-MOORE,Myrah Strawderman A 11/09/2012, 11:06 AM

## 2012-11-09 NOTE — Progress Notes (Signed)
Dr Tamela Oddi notified of pt's change in VE, FHR pattern, and contraction pattern orders received

## 2012-11-10 ENCOUNTER — Encounter: Payer: Medicaid Other | Admitting: Obstetrics

## 2012-11-10 MED ORDER — RHO D IMMUNE GLOBULIN 1500 UNIT/2ML IJ SOLN
300.0000 ug | Freq: Once | INTRAMUSCULAR | Status: AC
  Administered 2012-11-10: 300 ug via INTRAMUSCULAR
  Filled 2012-11-10: qty 2

## 2012-11-10 NOTE — Progress Notes (Signed)
Post Partum Day 1 Subjective: no complaints  Objective: Blood pressure 103/64, pulse 70, temperature 98.6 F (37 C), temperature source Oral, resp. rate 18, height 5\' 2"  (1.575 m), weight 152 lb (68.947 kg), last menstrual period 01/30/2012, SpO2 92.00%, unknown if currently breastfeeding.  Physical Exam:  General: alert and no distress Lochia: appropriate Uterine Fundus: firm Incision: healing well DVT Evaluation: No evidence of DVT seen on physical exam.   Recent Labs  11/09/12 1000  HGB 13.4  HCT 39.1    Assessment/Plan: Plan for discharge tomorrow   LOS: 1 day   HARPER,CHARLES A 11/10/2012, 8:47 AM

## 2012-11-10 NOTE — Progress Notes (Signed)
UR chart review completed.  

## 2012-11-10 NOTE — Plan of Care (Signed)
Problem: Phase II Progression Outcomes Goal: Rh isoimmunization per orders Outcome: Not Progressing Needs Rhogam ( Baby A + )  Problem: Discharge Progression Outcomes Goal: MMR given as ordered Outcome: Not Progressing Needs MMR

## 2012-11-11 ENCOUNTER — Encounter: Payer: Self-pay | Admitting: Obstetrics

## 2012-11-11 LAB — RH IG WORKUP (INCLUDES ABO/RH)
ABO/RH(D): A NEG
Antibody Screen: NEGATIVE
Fetal Screen: NEGATIVE
Unit division: 0

## 2012-11-11 MED ORDER — IBUPROFEN 600 MG PO TABS: 600.0000 mg | ORAL_TABLET | Freq: Four times a day (QID) | ORAL | Status: DC | PRN

## 2012-11-11 MED ORDER — OXYCODONE-ACETAMINOPHEN 5-325 MG PO TABS: 1.0000 | ORAL_TABLET | ORAL | Status: DC | PRN

## 2012-11-11 NOTE — Progress Notes (Signed)
Post Partum Day 2 Subjective: no complaints  Objective: Blood pressure 102/52, pulse 83, temperature 98.8 F (37.1 C), temperature source Oral, resp. rate 18, height 5\' 2"  (1.575 m), weight 152 lb (68.947 kg), last menstrual period 01/30/2012, SpO2 92.00%, unknown if currently breastfeeding.  Physical Exam:  General: alert and no distress Lochia: appropriate Uterine Fundus: firm Incision: healing well DVT Evaluation: No evidence of DVT seen on physical exam.   Recent Labs  11/09/12 1000  HGB 13.4  HCT 39.1    Assessment/Plan: Discharge home   LOS: 2 days   HARPER,CHARLES A 11/11/2012, 7:03 AM

## 2012-11-11 NOTE — Discharge Summary (Signed)
Obstetric Discharge Summary Reason for Admission: onset of labor Prenatal Procedures: ultrasound Intrapartum Procedures: spontaneous vaginal delivery Postpartum Procedures: none Complications-Operative and Postpartum: none Hemoglobin  Date Value Range Status  11/09/2012 13.4  12.0 - 15.0 g/dL Final     HCT  Date Value Range Status  11/09/2012 39.1  36.0 - 46.0 % Final    Physical Exam:  General: alert and no distress Lochia: appropriate Uterine Fundus: firm Incision: healing well DVT Evaluation: No evidence of DVT seen on physical exam.  Discharge Diagnoses: Term Pregnancy-delivered  Discharge Information: Date: 11/11/2012 Activity: pelvic rest Diet: routine Medications: PNV, Ibuprofen, Colace and Percocet Condition: stable Instructions: refer to practice specific booklet Discharge to: home Follow-up Information   Follow up with Antionette Char A, MD. Schedule an appointment as soon as possible for Decker visit in 6 weeks.   Contact information:   [address] [place] [address] [phone]       Newborn Data: Live born female  Birth Weight: 7 lb 7.6 oz (3390 g) APGAR: 8, 9  Home with mother.  Erika Decker 11/11/2012, 7:10 AM

## 2012-11-18 ENCOUNTER — Encounter (HOSPITAL_COMMUNITY): Payer: Self-pay | Admitting: *Deleted

## 2012-11-18 ENCOUNTER — Emergency Department (HOSPITAL_COMMUNITY)
Admission: EM | Admit: 2012-11-18 | Discharge: 2012-11-18 | Disposition: A | Discharge status: Home / Self Care | Payer: Medicaid Other | Source: Home / Self Care | Attending: Emergency Medicine | Admitting: Emergency Medicine

## 2012-11-18 DIAGNOSIS — H68002 Unspecified Eustachian salpingitis, left ear: Secondary | ICD-10-CM

## 2012-11-18 DIAGNOSIS — H68009 Unspecified Eustachian salpingitis, unspecified ear: Secondary | ICD-10-CM

## 2012-11-18 DIAGNOSIS — M26629 Arthralgia of temporomandibular joint, unspecified side: Secondary | ICD-10-CM

## 2012-11-18 DIAGNOSIS — M2669 Other specified disorders of temporomandibular joint: Secondary | ICD-10-CM

## 2012-11-18 MED ORDER — IBUPROFEN 800 MG PO TABS
ORAL_TABLET | ORAL | Status: AC
  Filled 2012-11-18: qty 1

## 2012-11-18 MED ORDER — AMOXICILLIN 500 MG PO CAPS: 1000.0000 mg | ORAL_CAPSULE | Freq: Three times a day (TID) | ORAL | Status: DC

## 2012-11-18 MED ORDER — MELOXICAM 15 MG PO TABS: 15.0000 mg | ORAL_TABLET | Freq: Every day | ORAL | Status: DC

## 2012-11-18 MED ORDER — LORATADINE 10 MG PO TABS: 10.0000 mg | ORAL_TABLET | Freq: Every day | ORAL | Status: DC

## 2012-11-18 MED ORDER — IBUPROFEN 800 MG PO TABS
800.0000 mg | ORAL_TABLET | Freq: Once | ORAL | Status: AC
  Administered 2012-11-18: 800 mg via ORAL

## 2012-11-18 MED ORDER — FLUTICASONE PROPIONATE 50 MCG/ACT NA SUSP: 2.0000 | Freq: Every day | NASAL | Status: DC

## 2012-11-18 NOTE — ED Notes (Signed)
Pt  Reports  Symptoms  Of L  Earache  X  1  Week       Pt  Also  Reports  A  Headache  And  Fleeting  Pain  r  Eye     10  Days   Post Partum

## 2012-11-18 NOTE — ED Provider Notes (Signed)
Chief Complaint:   Chief Complaint  Patient presents with  . Otalgia    History of Present Illness:   Erika Decker is a 27 year old female who has had a one-week history of left ear pain and congestion. She's also had slight nasal congestion headache. She denies any fever, chills, purulent nasal drainage, sore throat, adenopathy, or cough. She has a history of allergy. She notes pain it is a little worse in that she bites her chews. She's had no clicking or popping of the TMJ.  Review of Systems:  Other than noted above, the patient denies any of the following symptoms: Systemic:  No fevers, chills, sweats, weight loss or gain, fatigue, or tiredness. Eye:  No redness, pain, discharge, itching, blurred vision, or diplopia. ENT:  No headache, nasal congestion, sneezing, itching, epistaxis, ear pain, congestion, decreased hearing, ringing in ears, vertigo, or tinnitus.  No oral lesions, sore throat, pain on swallowing, or hoarseness. Neck:  No mass, tenderness or adenopathy. Lungs:  No coughing, wheezing, or shortness of breath. Skin:  No rash or itching.  PMFSH:  Past medical history, family history, social history, meds, and allergies were reviewed.  Physical Exam:   Vital signs:  BP 107/59  Pulse 70  Temp(Src) 98.2 F (36.8 C) (Oral)  Resp 16  SpO2 99%  LMP 01/30/2012 General:  Alert and oriented.  In no distress.  Skin warm and dry. Eye:  PERRL, full EOMs, lids and conjunctiva normal.   ENT:  TMs and canals clear.  Nasal mucosa not congested and without drainage.  Mucous membranes moist, no oral lesions, normal dentition, pharynx clear.  No cranial or facial pain to palplation. There was mild pain to palpation over the left TMJ and slight crepitus. Neck:  Supple, full ROM.  No adenopathy, tenderness or mass.  Thyroid normal. Lungs:  Breath sounds clear and equal bilaterally.  No wheezes, rales or rhonchi. Heart:  Rhythm regular, without extrasystoles.  No gallops or murmers. Skin:   Clear, warm and dry.  Course in Urgent Care Center:   Given ibuprofen 800 mg by mouth for pain.  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 A diagnosis of Eustachian salpingitis, left was also pertinent to this visit.  Her ear looks completely normal. They may be due to TMJ syndrome or eustachian salpingitis or both. I suggested a soft diet, heat, and meloxicam. To cover for the possibility of salpingitis, given amoxicillin, fluticasone, and loratadine. If no better in 2 weeks suggested followup with Dr. Melvenia Beam.  Plan:   1.  The following meds were prescribed:   Discharge Medication List as of 11/18/2012 11:56 AM    START taking these medications   Details  amoxicillin (AMOXIL) 500 MG capsule Take 2 capsules (1,000 mg total) by mouth 3 (three) times daily., Starting 11/18/2012, Until Discontinued, Normal    fluticasone (FLONASE) 50 MCG/ACT nasal spray Place 2 sprays into the nose daily., Starting 11/18/2012, Until Discontinued, Normal    loratadine (CLARITIN) 10 MG tablet Take 1 tablet (10 mg total) by mouth daily., Starting 11/18/2012, Until Discontinued, Normal    meloxicam (MOBIC) 15 MG tablet Take 1 tablet (15 mg total) by mouth daily., Starting 11/18/2012, Until Discontinued, Normal       2.  The patient was instructed in symptomatic care and handouts were given. 3.  The patient was told to return if becoming worse in any way, if no better in 3 or 4 days, and given some red flag symptoms such  as worsening pain, fever, or swelling that would indicate earlier return.  Follow up:  The patient was told to follow up with Dr. Melvenia Beam if no better in 2 weeks.       Reuben Likes, MD 11/18/12 2155

## 2012-12-24 ENCOUNTER — Ambulatory Visit (INDEPENDENT_AMBULATORY_CARE_PROVIDER_SITE_OTHER): Payer: Medicaid Other | Admitting: Obstetrics

## 2012-12-24 ENCOUNTER — Encounter: Payer: Self-pay | Admitting: Obstetrics

## 2012-12-24 ENCOUNTER — Encounter: Payer: Self-pay | Admitting: *Deleted

## 2012-12-24 ENCOUNTER — Ambulatory Visit: Payer: Medicaid Other | Admitting: Obstetrics

## 2012-12-24 DIAGNOSIS — Z3009 Encounter for other general counseling and advice on contraception: Secondary | ICD-10-CM

## 2012-12-24 DIAGNOSIS — Z3202 Encounter for pregnancy test, result negative: Secondary | ICD-10-CM

## 2012-12-24 DIAGNOSIS — IMO0001 Reserved for inherently not codable concepts without codable children: Secondary | ICD-10-CM

## 2012-12-24 NOTE — Progress Notes (Signed)
.   Subjective:     Erika Decker is a 27 y.o. female who presents for a postpartum visit. She is 6 weeks postpartum following a spontaneous vaginal delivery. I have fully reviewed the prenatal and intrapartum course. The delivery was at 40.4 gestational weeks. Outcome: spontaneous vaginal delivery. Anesthesia: IV sedation. Postpartum course has been normal. Baby's course has been normal. Baby is feeding by breast. Bleeding no bleeding. Bowel function is normal. Bladder function is normal. Patient is not sexually active. Contraception method is none. Postpartum depression screening: negative.  Patient is interested in the [place].  The following portions of the patient's history were reviewed and updated as appropriate: allergies, current medications, past family history, past medical history, past social history, past surgical history and problem list.  Review of Systems Pertinent items are noted in HPI.   Objective:    BP 112/71  Pulse 48  Temp(Src) 98.2 F (36.8 C) (Oral)  Ht 5' (1.524 m)  Wt 134 lb 12.8 oz (61.145 kg)  BMI 26.33 kg/m2  Breastfeeding? Yes  General:  alert and no distress   Breasts:  inspection negative, no nipple discharge or bleeding, no masses or nodularity palpable  Lungs: not examined  Heart:  not examined  Abdomen: normal findings: soft, non-tender   Vulva:  normal  Vagina: normal vagina  Cervix:  no lesions  Corpus: normal size, contour, position, consistency, mobility, non-tender  Adnexa:  no mass, fullness, tenderness  Rectal Exam: Not performed.        Assessment:     Normal postpartum exam. Pap smear not done at today's visit.   Plan:    1. Contraception: abstinence 2. Wants Nexplanon 3. Follow up in: With menses for Mirena IUD.

## 2013-02-25 ENCOUNTER — Ambulatory Visit: Payer: Medicaid Other | Admitting: Advanced Practice Midwife

## 2013-02-27 ENCOUNTER — Ambulatory Visit: Payer: Medicaid Other | Admitting: Advanced Practice Midwife

## 2013-03-10 ENCOUNTER — Encounter (HOSPITAL_COMMUNITY): Payer: Self-pay | Admitting: Nurse Practitioner

## 2013-03-10 ENCOUNTER — Emergency Department (HOSPITAL_COMMUNITY)
Admission: EM | Admit: 2013-03-10 | Discharge: 2013-03-10 | Disposition: A | Discharge status: Home / Self Care | Payer: Medicaid Other | Attending: Emergency Medicine | Admitting: Emergency Medicine

## 2013-03-10 DIAGNOSIS — R21 Rash and other nonspecific skin eruption: Secondary | ICD-10-CM | POA: Insufficient documentation

## 2013-03-10 DIAGNOSIS — Y9210 Unspecified residential institution as the place of occurrence of the external cause: Secondary | ICD-10-CM | POA: Insufficient documentation

## 2013-03-10 DIAGNOSIS — W57XXXA Bitten or stung by nonvenomous insect and other nonvenomous arthropods, initial encounter: Secondary | ICD-10-CM

## 2013-03-10 DIAGNOSIS — Y939 Activity, unspecified: Secondary | ICD-10-CM | POA: Insufficient documentation

## 2013-03-10 DIAGNOSIS — L299 Pruritus, unspecified: Secondary | ICD-10-CM | POA: Insufficient documentation

## 2013-03-10 MED ORDER — PERMETHRIN 5 % EX CREA: TOPICAL_CREAM | CUTANEOUS | Status: DC

## 2013-03-10 NOTE — ED Notes (Signed)
C/o itchy painful bumps to face and arms since Saturday. States no one in house with same symptoms and did check her house for bugs, did nto see any bugs, sprayed raid through the house just in case

## 2013-03-10 NOTE — ED Provider Notes (Signed)
CSN: [id number]     Arrival date & time 03/10/13  1049 History     First MD Initiated Contact with Patient 03/10/13 1128     Chief Complaint  Patient presents with  . Rash   (Consider location/radiation/quality/duration/timing/severity/associated sxs/prior Treatment) Patient is a 27 y.o. female presenting with rash. The history is provided by the patient and medical records.  Rash  Patient presents to the ED for pruritic, "bumps" on her face and arms x3 days.  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 States husband and time had similar bites, but she thinks his are bug bites from his job. Denies any drainage from the lesions. Has washed her bed linens, towels, and blankets but itching continues. She has checked her home and bed for bugs but has not seen any. Did recently switched from [facility] soap to Dial soap-- has used before without rxn.  Denies any fevers, sweats, or chills.  No bodyaches or tick exposure.  Has not tried any topical creams or benadryl PTA.  Pt is concerned for possible exposure/infestation from work.   Past Medical History  Diagnosis Date  . Abnormal Pap smear 2011    Colpo > paps nml after  . Dizziness    Past Surgical History  Procedure Laterality Date  . Colposcopy     Family History  Problem Relation Age of Onset  . Diabetes Mother   . Hypertension Mother   . Mental illness Brother   . Asthma Brother    History  Substance Use Topics  . Smoking status: Never Smoker   . Smokeless tobacco: Never Used  . Alcohol Use: No   OB History   Grav Para Term Preterm Abortions TAB SAB Ect Mult Living   3 3 3       3      Review of Systems  Skin: Positive for rash.  All other systems reviewed and are negative.    Allergies  Review of patient's allergies indicates no known allergies.  Home Medications  No current outpatient prescriptions on file. BP  122/79  Pulse 50  Temp(Src) 98 F (36.7 C) (Oral)  Resp 16  Ht 5' (1.524 m)  Wt 134 lb (60.782 kg)  BMI 26.17 kg/m2  SpO2 98%  Physical Exam  Nursing note and vitals reviewed. Constitutional: She is oriented to person, place, and time. She appears well-developed and well-nourished.  HENT:  Head: Normocephalic and atraumatic.  Mouth/Throat: Oropharynx is clear and moist.  Eyes: Conjunctivae and EOM are normal. Pupils are equal, round, and reactive to light.  Small bug bite below left eye with localized swelling; no erythema, induration, or cellulitis  Neck: Normal range of motion. Neck supple.  Cardiovascular: Normal rate, regular rhythm and normal heart sounds.   Pulmonary/Chest: Effort normal and breath sounds normal.  Musculoskeletal: Normal range of motion.  Neurological: She is alert and oriented to person, place, and time.  Skin: Skin is warm and dry.  Small pruritic, bug bites to BUE and left cheek; no drainage, surrounding swelling, erythema, induration, or signs of cellulitis  Psychiatric: She has a normal mood and affect.    ED Course   Procedures (including critical care time)  Labs Reviewed - No data to display No results found.  1. Bug bites     MDM   Sx possibly due to change in soap but pt does have bug bites without recent tick  exposure or signs of infection.  Will cover for possible bed-bug or scabies exposure-- Rx permethrin, instructed to take OTC benadryl for itching.  FU with [facility] if sx not improving.  Discussed plan with pt, she agreed.  Return precautions advised.   Garlon Hatchet, PA-C 03/10/13 1340

## 2013-03-10 NOTE — ED Notes (Signed)
Pt. Stated, I've had little bites all over me, It started Sat and I'm the only one in the house with the bites.

## 2013-03-11 ENCOUNTER — Telehealth (HOSPITAL_COMMUNITY): Payer: Self-pay | Admitting: *Deleted

## 2013-03-11 NOTE — ED Provider Notes (Signed)
Medical screening examination/treatment/procedure(s) were performed by non-physician practitioner and as supervising physician I was immediately available for consultation/collaboration.   Efrata Brunner M Cabella Kimm, DO 03/11/13 2218 

## 2013-04-30 ENCOUNTER — Ambulatory Visit (INDEPENDENT_AMBULATORY_CARE_PROVIDER_SITE_OTHER): Payer: Medicaid Other | Admitting: Obstetrics

## 2013-04-30 ENCOUNTER — Encounter: Payer: Self-pay | Admitting: Obstetrics

## 2013-04-30 VITALS — BP 98/64 | HR 81 | Temp 98.0°F | Ht 60.0 in | Wt 132.0 lb

## 2013-04-30 DIAGNOSIS — N63 Unspecified lump in unspecified breast: Secondary | ICD-10-CM | POA: Insufficient documentation

## 2013-04-30 DIAGNOSIS — Z3200 Encounter for pregnancy test, result unknown: Secondary | ICD-10-CM

## 2013-04-30 DIAGNOSIS — Z3201 Encounter for pregnancy test, result positive: Secondary | ICD-10-CM

## 2013-04-30 LAB — POCT URINE PREGNANCY: Preg Test, Ur: POSITIVE

## 2013-04-30 MED ORDER — VITAFOL ULTRA 29-0.6-0.4-200 MG PO CAPS: 1.0000 | ORAL_CAPSULE | Freq: Every day | ORAL | Status: DC

## 2013-04-30 NOTE — Progress Notes (Signed)
Subjective:     Erika Decker is a 27 y.o. female here for a routine exam.  Current complaints: Pt in office today for a lump found in her right breast. Pt states she found the lump doing her self breast exam. Pt states hse found the lump about 2 days ago. Pt states she has pain only when the area is touched. Personal health questionnaire reviewed: yes.   Gynecologic History No LMP recorded. Contraception: none   Obstetric History OB History  Gravida Para Term Preterm AB SAB TAB Ectopic Multiple Living  3 3 3       3     # Outcome Date GA Lbr Len/2nd Weight Sex Delivery Anes PTL Lv  3 TRM 11/09/12 [redacted]w[redacted]d 06:05 / 00:27 7 lb 7.6 oz (3.39 kg) M SVD None  Y  2 TRM 12/23/09 [redacted]w[redacted]d  7 lb 7 oz (3.374 kg) F SVD None  Y     Comments: No complications  1 TRM 10/05/07 [redacted]w[redacted]d  6 lb 4 oz (2.835 kg) M SVD EPI  Y     Comments: No complications       The following portions of the patient's history were reviewed and updated as appropriate: allergies, current medications, past family history, past medical history, past social history, past surgical history and problem list.  Review of Systems Pertinent items are noted in HPI.    Objective:    General appearance: alert and no distress Breasts: normal appearance, no masses or tenderness, Inspection negative, No nipple retraction or dimpling, No nipple discharge or bleeding, No axillary or supraclavicular adenopathy, positive findings: 2 cm, smooth, round, soft, mobile and tender nodule located on the right periareolar at 9 o'clock.    Assessment:    Breast mass.  Right breast.  Early 1st trimester pregnancy.   Plan:    Education reviewed: Management of breast masses in pregnancy... Follow up in: a few weeks. [facility]   Plans to F/U at [facility] for prenatal care.

## 2013-05-12 ENCOUNTER — Other Ambulatory Visit: Payer: Self-pay

## 2013-05-12 ENCOUNTER — Ambulatory Visit
Admission: RE | Admit: 2013-05-12 | Discharge: 2013-05-12 | Disposition: A | Discharge status: Home / Self Care | Payer: Medicaid Other | Source: Ambulatory Visit | Attending: Obstetrics | Admitting: Obstetrics

## 2013-05-12 ENCOUNTER — Other Ambulatory Visit: Payer: Self-pay | Admitting: Physician Assistant

## 2013-05-12 DIAGNOSIS — N63 Unspecified lump in unspecified breast: Secondary | ICD-10-CM

## 2013-05-28 ENCOUNTER — Encounter: Payer: Medicaid Other | Admitting: Obstetrics

## 2013-05-29 ENCOUNTER — Encounter (HOSPITAL_COMMUNITY): Payer: Self-pay | Admitting: *Deleted

## 2013-05-29 ENCOUNTER — Inpatient Hospital Stay (HOSPITAL_COMMUNITY)
Admission: AD | Admit: 2013-05-29 | Discharge: 2013-05-29 | Disposition: A | Discharge status: Home / Self Care | Payer: Medicaid Other | Source: Ambulatory Visit | Attending: Obstetrics | Admitting: Obstetrics

## 2013-05-29 DIAGNOSIS — O99891 Other specified diseases and conditions complicating pregnancy: Secondary | ICD-10-CM | POA: Insufficient documentation

## 2013-05-29 DIAGNOSIS — O26859 Spotting complicating pregnancy, unspecified trimester: Secondary | ICD-10-CM

## 2013-05-29 DIAGNOSIS — R109 Unspecified abdominal pain: Secondary | ICD-10-CM | POA: Insufficient documentation

## 2013-05-29 DIAGNOSIS — O26852 Spotting complicating pregnancy, second trimester: Secondary | ICD-10-CM

## 2013-05-29 LAB — URINALYSIS, ROUTINE W REFLEX MICROSCOPIC
Ketones, ur: NEGATIVE mg/dL
Leukocytes, UA: NEGATIVE
Nitrite: NEGATIVE
Protein, ur: NEGATIVE mg/dL
pH: 6.5 (ref 5.0–8.0)

## 2013-05-29 NOTE — Discharge Instructions (Signed)
Preventing Preterm Labor Preterm labor is when a pregnant woman has contractions that cause the cervix to open, shorten, and thin before 37 weeks of pregnancy. You will have regular contractions (tightening) 2 to 3 minutes apart. This usually causes discomfort or pain. HOME CARE  Eat a healthy diet.  Take your vitamins as told by your doctor.  Drink enough fluids to keep your pee (urine) clear or pale yellow every day.  Get rest and sleep.  Do not have sex if you are at high risk for preterm labor.  Follow your doctor's advice about activity, medicines, and tests.  Avoid stress.  Avoid hard labor or exercise that lasts for a long time.  Do not smoke. GET HELP RIGHT AWAY IF:   You are having contractions.  You have belly (abdominal) pain.  You have bleeding from your vagina.  You have pain when you pee (urinate).  You have abnormal discharge from your vagina.  You have a temperature by mouth above 102 F (38.9 C). MAKE SURE YOU:  Understand these instructions.  Will watch your condition.  Will get help if you are not doing well or get worse. Document Released: 10/12/2008 Document Revised: 10/08/2011 Document Reviewed: 10/12/2008 [facility] Patient Information 2014 [place], [place]. Pregnancy - Second Trimester The second trimester is the period between 13 to 27 weeks of your pregnancy. It is important to follow your doctor's instructions. HOME CARE   Do not smoke.  Do not drink alcohol or use drugs.  Only take medicine as told by your doctor.  Take prenatal vitamins as told. The vitamin should contain 1 milligram of folic acid.  Exercise.  Eat healthy foods. Eat regular, well-balanced meals.  You can have sex (intercourse) if there are no other problems with the pregnancy.  Do not use hot tubs, steam rooms, or saunas.  Wear a seat belt while driving.  Avoid raw meat, uncooked cheese, and litter boxes and soil used by cats.  Visit your dentist. Shirlee Limerick  are okay. GET HELP RIGHT AWAY IF:   You have a temperature by mouth above 102 F (38.9 C), not controlled by medicine.  Fluid is coming from your vagina.  Blood is coming from your vagina. Light spotting is common, especially after sex (intercourse).  You have a bad smelling fluid (discharge) coming from the vagina. The fluid changes from clear to white.  You still feel sick to your stomach (nauseous).  You throw up (vomit) blood.  You lose or gain more than 2 pounds (0.9 kilograms) of weight in a week, or as suggested by your doctor.  Your face, hands, feet, or legs get puffy (swell).  You get exposed to [place] measles and have never had them.  You get exposed to fifth disease or chickenpox.  You have belly (abdominal) pain.  You have a bad headache that will not go away.  You have watery poop (diarrhea), pain when you pee (urinate), or have shortness of breath.  You start to have problems seeing (blurry or double vision).  You fall, are in a car accident, or have any kind of trauma.  There is mental or physical violence at home.  You have any concerns or worries during your pregnancy. MAKE SURE YOU:   Understand these instructions.  Will watch your condition.  Will get help right away if you are not doing well or get worse. Document Released: 10/10/2009 Document Revised: 10/08/2011 Document Reviewed: 10/10/2009 [facility] Patient Information 2014 [place], [place].

## 2013-05-29 NOTE — MAU Note (Signed)
Cramping last night and today.  Denies GI and GU complaints.

## 2013-05-29 NOTE — MAU Provider Note (Signed)
  History     CSN: [id number]  Arrival date and time: 05/29/13 1042   First Provider Initiated Contact with Patient 05/29/13 1122      Chief Complaint  Patient presents with  . Abdominal Cramping   HPI This is a 27 y.o. female at [redacted]w[redacted]d who presents with c/o cramping.  Denies leaking or bleeding. States she "just wants to check and see if my cervix is OK".  Had some preterm labor with her other pregnancies and wants to have her cervix checked.    RN Note: Cramping last night and today. Denies GI and GU complaints  OB History   Grav Para Term Preterm Abortions TAB SAB Ect Mult Living   4 3 3       3       Past Medical History  Diagnosis Date  . Abnormal Pap smear 2011    Colpo > paps nml after  . Dizziness     Past Surgical History  Procedure Laterality Date  . Colposcopy      Family History  Problem Relation Age of Onset  . Diabetes Mother   . Hypertension Mother   . Mental illness Brother   . Asthma Brother     History  Substance Use Topics  . Smoking status: Never Smoker   . Smokeless tobacco: Never Used  . Alcohol Use: No    Allergies: No Known Allergies  Prescriptions prior to admission  Medication Sig Dispense Refill  . Prenatal Vit-Fe Fumarate-FA (PRENATAL MULTIVITAMIN) TABS tablet Take 1 tablet by mouth daily at 12 noon.        Review of Systems  Constitutional: Negative for fever, chills and malaise/fatigue.  Gastrointestinal: Positive for abdominal pain. Negative for nausea, vomiting, diarrhea and constipation.  Genitourinary: Negative for dysuria.  Neurological: Negative for dizziness.   Physical Exam   Blood pressure 115/53, pulse 77, temperature 98.2 F (36.8 C), temperature source Oral, resp. rate 18, height 5' (1.524 m), weight 62.143 kg (137 lb), last menstrual period 02/14/2013.  Physical Exam  Constitutional: She is oriented to person, place, and time. She appears well-developed and well-nourished. No distress.  Cardiovascular:  Normal rate.   Respiratory: Effort normal.  GI: Soft. She exhibits no distension. There is no tenderness. There is no rebound and no guarding.  Genitourinary: Vagina normal and uterus normal. No vaginal discharge found.  Cervix long and closed  Musculoskeletal: Normal range of motion.  Neurological: She is alert and oriented to person, place, and time.  Skin: Skin is warm and dry.  Psychiatric: She has a normal mood and affect.    MAU Course  Procedures  MDM   Assessment and Plan  A:  SIUP at [redacted]w[redacted]d       Cramping with no cervical change  P:  Reassured that cervix was good        Offered limited ibuprofen but patient Declines meds        PO hydration       Followup at Dr Verdell Carmine office  [facility] 05/29/2013, 12:16 PM

## 2013-06-09 ENCOUNTER — Ambulatory Visit (INDEPENDENT_AMBULATORY_CARE_PROVIDER_SITE_OTHER): Payer: Medicaid Other | Admitting: Obstetrics

## 2013-06-09 ENCOUNTER — Encounter: Payer: Self-pay | Admitting: Obstetrics

## 2013-06-09 VITALS — BP 105/64 | Temp 98.0°F | Wt 139.0 lb

## 2013-06-09 DIAGNOSIS — Z369 Encounter for antenatal screening, unspecified: Secondary | ICD-10-CM

## 2013-06-09 DIAGNOSIS — Z3480 Encounter for supervision of other normal pregnancy, unspecified trimester: Secondary | ICD-10-CM

## 2013-06-09 DIAGNOSIS — Z3482 Encounter for supervision of other normal pregnancy, second trimester: Secondary | ICD-10-CM

## 2013-06-09 DIAGNOSIS — Z113 Encounter for screening for infections with a predominantly sexual mode of transmission: Secondary | ICD-10-CM

## 2013-06-09 LAB — POCT URINALYSIS DIPSTICK
Bilirubin, UA: NEGATIVE
Nitrite, UA: NEGATIVE
pH, UA: 7

## 2013-06-09 NOTE — Progress Notes (Signed)
Pulse- 90  Subjective:    Erika Decker is being seen today for her first obstetrical visit.  This is not a planned pregnancy. She is at [redacted]w[redacted]d gestation. Her obstetrical history is significant for short interval between pregnancies. Relationship with FOB: spouse, living together. Patient Unsure intend to breast feed. Pregnancy history fully reviewed.  Menstrual History: OB History   Grav Para Term Preterm Abortions TAB SAB Ect Mult Living   4 3 3       3       Menarche age: 20 RegularPatient's last menstrual period was 02/14/2013.    The following portions of the patient's history were reviewed and updated as appropriate: allergies, current medications, past family history, past medical history, past social history, past surgical history and problem list.  Review of Systems Pertinent items are noted in HPI.    Objective:    General appearance: alert and no distress Abdomen: normal findings: soft, non-tender Pelvic: cervix normal in appearance, external genitalia normal, no adnexal masses or tenderness, no cervical motion tenderness, rectovaginal septum normal, vagina normal without discharge and uterus enlarged ~ 16 weeks, soft, NT.    Assessment:    Pregnancy at [redacted]w[redacted]d weeks    Plan:    Initial labs drawn. Prenatal vitamins.  Counseling provided regarding continued use of seat belts, cessation of alcohol consumption, smoking or use of illicit drugs; infection precautions i.e., influenza/TDAP immunizations, toxoplasmosis,CMV, parvovirus, listeria and varicella; workplace safety, exercise during pregnancy; routine dental care, safe medications, sexual activity, hot tubs, saunas, pools, travel, caffeine use, fish and methlymercury, potential toxins, hair treatments, varicose veins Weight gain recommendations reviewed: underweight/BMI< 18.5--> gain 28 - 40 lbs; normal weight/BMI 18.5 - 24.9--> gain 25 - 35 lbs; overweight/BMI 25 - 29.9--> gain 15 - 25 lbs; obese/BMI >30->gain  11 - 20  lbs Problem list reviewed and updated. AFP3 discussed: requested. Role of ultrasound in pregnancy discussed; fetal survey: requested. Amniocentesis discussed: not indicated. Follow up in 4 weeks. 50% of 20 min visit spent on counseling and coordination of care.

## 2013-06-10 ENCOUNTER — Encounter: Payer: Self-pay | Admitting: Obstetrics

## 2013-06-10 ENCOUNTER — Other Ambulatory Visit: Payer: Self-pay | Admitting: *Deleted

## 2013-06-10 DIAGNOSIS — B373 Candidiasis of vulva and vagina: Secondary | ICD-10-CM

## 2013-06-10 LAB — OBSTETRIC PANEL
Basophils Absolute: 0 10*3/uL (ref 0.0–0.1)
Basophils Relative: 0 % (ref 0–1)
Hemoglobin: 12.8 g/dL (ref 12.0–15.0)
Hepatitis B Surface Ag: NEGATIVE
MCHC: 33.8 g/dL (ref 30.0–36.0)
Neutro Abs: 4 10*3/uL (ref 1.7–7.7)
Neutrophils Relative %: 72 % (ref 43–77)
RDW: 13.9 % (ref 11.5–15.5)
WBC: 5.6 10*3/uL (ref 4.0–10.5)

## 2013-06-10 LAB — WET PREP BY MOLECULAR PROBE
Gardnerella vaginalis: NEGATIVE
Trichomonas vaginosis: NEGATIVE

## 2013-06-10 LAB — VARICELLA ZOSTER ANTIBODY, IGG: Varicella IgG: 334.5 Index — ABNORMAL HIGH (ref <135.00)

## 2013-06-10 LAB — PAP IG W/ RFLX HPV ASCU

## 2013-06-10 MED ORDER — FLUCONAZOLE 150 MG PO TABS: 150.0000 mg | ORAL_TABLET | Freq: Once | ORAL | Status: DC

## 2013-06-11 LAB — HEMOGLOBINOPATHY EVALUATION
Hemoglobin Other: 0 %
Hgb A2 Quant: 3.5 % — ABNORMAL HIGH (ref 2.2–3.2)
Hgb F Quant: 0 % (ref 0.0–2.0)
Hgb S Quant: 34.2 % — ABNORMAL HIGH

## 2013-06-11 LAB — AFP, QUAD SCREEN
AFP: 11.9 IU/mL
Age Alone: 1:993 {titer}
Curr Gest Age: 16.3 wks.days
Down Syndrome Scr Risk Est: 1:7 {titer}
HCG, Total: 61348 m[IU]/mL
MoM for INH: 1.6
Open Spina bifida: NEGATIVE

## 2013-06-17 ENCOUNTER — Other Ambulatory Visit: Payer: Medicaid Other

## 2013-06-23 ENCOUNTER — Ambulatory Visit (INDEPENDENT_AMBULATORY_CARE_PROVIDER_SITE_OTHER): Payer: Medicaid Other

## 2013-06-23 ENCOUNTER — Other Ambulatory Visit: Payer: Self-pay | Admitting: Obstetrics

## 2013-06-23 ENCOUNTER — Encounter: Payer: Self-pay | Admitting: Obstetrics

## 2013-06-23 DIAGNOSIS — O365990 Maternal care for other known or suspected poor fetal growth, unspecified trimester, not applicable or unspecified: Secondary | ICD-10-CM

## 2013-06-23 DIAGNOSIS — O365921 Maternal care for other known or suspected poor fetal growth, second trimester, fetus 1: Secondary | ICD-10-CM

## 2013-06-23 DIAGNOSIS — Z369 Encounter for antenatal screening, unspecified: Secondary | ICD-10-CM

## 2013-06-23 DIAGNOSIS — O3680X1 Pregnancy with inconclusive fetal viability, fetus 1: Secondary | ICD-10-CM

## 2013-06-24 ENCOUNTER — Encounter: Payer: Self-pay | Admitting: Advanced Practice Midwife

## 2013-06-24 LAB — US OB DETAIL + 14 WK

## 2013-07-06 ENCOUNTER — Telehealth: Payer: Self-pay | Admitting: *Deleted

## 2013-07-09 ENCOUNTER — Ambulatory Visit (INDEPENDENT_AMBULATORY_CARE_PROVIDER_SITE_OTHER): Payer: Medicaid Other | Admitting: Obstetrics

## 2013-07-09 VITALS — BP 125/76 | Temp 98.9°F | Wt 143.0 lb

## 2013-07-09 DIAGNOSIS — Z3482 Encounter for supervision of other normal pregnancy, second trimester: Secondary | ICD-10-CM

## 2013-07-09 DIAGNOSIS — Z1389 Encounter for screening for other disorder: Secondary | ICD-10-CM

## 2013-07-09 DIAGNOSIS — Z363 Encounter for antenatal screening for malformations: Secondary | ICD-10-CM

## 2013-07-09 DIAGNOSIS — Z3480 Encounter for supervision of other normal pregnancy, unspecified trimester: Secondary | ICD-10-CM

## 2013-07-09 LAB — POCT URINALYSIS DIPSTICK
Bilirubin, UA: NEGATIVE
Blood, UA: NEGATIVE
Ketones, UA: NEGATIVE
Nitrite, UA: NEGATIVE
Spec Grav, UA: 1.01
Urobilinogen, UA: NEGATIVE
pH, UA: 7

## 2013-07-09 NOTE — Progress Notes (Signed)
Pulse: 88

## 2013-07-10 ENCOUNTER — Encounter: Payer: Self-pay | Admitting: Obstetrics

## 2013-07-10 LAB — AFP, QUAD SCREEN
Age Alone: 1:988 {titer}
Down Syndrome Scr Risk Est: 1:945 {titer}
INH: 254 pg/mL
MoM for hCG: 1.65
Osb Risk: 1:11100 {titer}
Tri 18 Scr Risk Est: NEGATIVE
Trisomy 18 (Edward) Syndrome Interp.: 1:1900 {titer}

## 2013-07-14 ENCOUNTER — Other Ambulatory Visit: Payer: Self-pay | Admitting: *Deleted

## 2013-07-14 DIAGNOSIS — B373 Candidiasis of vulva and vagina: Secondary | ICD-10-CM

## 2013-07-14 MED ORDER — FLUCONAZOLE 150 MG PO TABS: 150.0000 mg | ORAL_TABLET | Freq: Every day | ORAL | Status: DC

## 2013-07-18 ENCOUNTER — Inpatient Hospital Stay (HOSPITAL_COMMUNITY)
Admission: AD | Admit: 2013-07-18 | Discharge: 2013-07-18 | Disposition: A | Discharge status: Home / Self Care | Payer: Medicaid Other | Source: Ambulatory Visit | Attending: Obstetrics | Admitting: Obstetrics

## 2013-07-18 ENCOUNTER — Encounter (HOSPITAL_COMMUNITY): Payer: Self-pay | Admitting: *Deleted

## 2013-07-18 DIAGNOSIS — O99891 Other specified diseases and conditions complicating pregnancy: Secondary | ICD-10-CM | POA: Insufficient documentation

## 2013-07-18 DIAGNOSIS — R109 Unspecified abdominal pain: Secondary | ICD-10-CM | POA: Insufficient documentation

## 2013-07-18 LAB — URINALYSIS, ROUTINE W REFLEX MICROSCOPIC
Hgb urine dipstick: NEGATIVE
Ketones, ur: NEGATIVE mg/dL
Leukocytes, UA: NEGATIVE
Protein, ur: NEGATIVE mg/dL
Urobilinogen, UA: 0.2 mg/dL (ref 0.0–1.0)

## 2013-07-18 LAB — WET PREP, GENITAL: Yeast Wet Prep HPF POC: NONE SEEN

## 2013-07-18 NOTE — MAU Provider Note (Signed)
History     CSN: [id number]  Arrival date and time: 07/18/13 1612   None     Chief Complaint  Patient presents with  . Abdominal Pain   HPI Erika Decker is a 27 y.o. [id number] at [redacted]w[redacted]d presents for evaluation for lower abdominal cramping that began Wednesday and occurs ever 1-3 hrs lasting <17minutes. Pt denies vaginal bleeding, loss of fluid or fetal movement. Denies Discharge. Pt reports that she has been mildly stressed at work as well. Pt reports that she was recently treated for a yeast infection 2 days ago by her PCM. All prior pregnancies have been term.  No Headaches, f/c, sob, n/v, d/c, urinary symptoms.  OB History   Grav Para Term Preterm Abortions TAB SAB Ect Mult Living   4 3 3       3       Past Medical History  Diagnosis Date  . Abnormal Pap smear 2011    Colpo > paps nml after  . Dizziness     Past Surgical History  Procedure Laterality Date  . Colposcopy      Family History  Problem Relation Age of Onset  . Diabetes Mother   . Hypertension Mother   . Mental illness Brother   . Asthma Brother     History  Substance Use Topics  . Smoking status: Never Smoker   . Smokeless tobacco: Never Used  . Alcohol Use: No    Allergies: No Known Allergies  Prescriptions prior to admission  Medication Sig Dispense Refill  . fluconazole (DIFLUCAN) 150 MG tablet Take 1 tablet (150 mg total) by mouth daily.  1 tablet  2  . Prenatal Vit-Fe Fumarate-FA (PRENATAL MULTIVITAMIN) TABS tablet Take 1 tablet by mouth daily at 12 noon.        ROS as above Physical Exam   Blood pressure 111/59, pulse 82, temperature 98.2 F (36.8 C), temperature source Oral, resp. rate 18, height 5' (1.524 m), weight 65.488 kg (144 lb 6 oz), last menstrual period 02/14/2013, not currently breastfeeding.  Physical Exam VSS, NAD NTTP, ND SSE: white discharge and with some clumbs. Cervix appear long and close.d SVE: Dilation: Closed Effacement (%): Thick Station:  Ballotable Exam by:: Dr. Ike Bene   FHT 160 by doppler  Results for Erika, Decker (MRN [id number]) as of 07/18/2013 17:29  Ref. Range 07/18/2013 16:31 07/18/2013 17:13  Yeast Wet Prep HPF POC Latest Range: NONE SEEN   NONE SEEN  Trich, Wet Prep Latest Range: NONE SEEN   NONE SEEN  Clue Cells Wet Prep HPF POC Latest Range: NONE SEEN   NONE SEEN  WBC, Wet Prep HPF POC Latest Range: NONE SEEN   MODERATE (A)  Color, Urine Latest Range: YELLOW  YELLOW   APPearance Latest Range: CLEAR  CLEAR   Specific Gravity, Urine Latest Range: 1.005-1.030  1.020   pH Latest Range: 5.0-8.0  7.0   Glucose Latest Range: NEGATIVE mg/dL NEGATIVE   Bilirubin Urine Latest Range: NEGATIVE  NEGATIVE   Ketones, ur Latest Range: NEGATIVE mg/dL NEGATIVE   Protein Latest Range: NEGATIVE mg/dL NEGATIVE   Urobilinogen, UA Latest Range: 0.0-1.0 mg/dL 0.2   Nitrite Latest Range: NEGATIVE  NEGATIVE   Leukocytes, UA Latest Range: NEGATIVE  NEGATIVE   Hgb urine dipstick Latest Range: NEGATIVE  NEGATIVE     MAU Course  Procedures  MDM Eval UA, Wet mount. Neg GC/C last month. SSE and SVE   Assessment and Plan  Erika Decker is a 27 y.o.  [id number] at [redacted]w[redacted]d here for complaints pt is concerned for ctx. Sx are low likely hood of labor given infrequency and low severity. Reassuring labs for no infection at this time. Will discharge home with follow up as previously scheduled 08/06/2012. Encourage hydration and PTL precautions reviewed.  Discussed with Dr. Clearance Coots and will discharge.  Tawana Scale 07/18/2013, 5:03 PM

## 2013-07-18 NOTE — MAU Note (Signed)
Pt states here for abd pain, cramping. Hx PTL prior pregnancy. Cramping began Wednesday. Denies uti s/s, no bleeding, is getting over a yeast infection. Points to suprapubic area, feels pain/pressure.

## 2013-07-30 NOTE — L&D Delivery Note (Signed)
Delivery Note At 6:06 PM a viable female was delivered via Vaginal, Spontaneous Delivery (Presentation: ; Occiput Anterior).  APGAR: 9, 9; weight 7 lb 4.2 oz (3295 g).   Placenta status: Intact, Spontaneous.  Cord: 3 vessels with the following complications: None.  Cord pH: none  Anesthesia: Local  Episiotomy: None Lacerations: 2nd degree Suture Repair: 2.0 chromic Est. Blood Loss (mL): 350  Mom to postpartum.  Baby to Couplet care / Skin to Skin.  [name] 01/03/2014, 7:29 PM

## 2013-08-04 ENCOUNTER — Encounter: Payer: Self-pay | Admitting: Obstetrics

## 2013-08-06 ENCOUNTER — Ambulatory Visit (HOSPITAL_COMMUNITY)
Admission: RE | Admit: 2013-08-06 | Discharge: 2013-08-06 | Disposition: A | Discharge status: Home / Self Care | Payer: Medicaid Other | Source: Ambulatory Visit | Attending: Obstetrics | Admitting: Obstetrics

## 2013-08-06 ENCOUNTER — Encounter: Payer: Self-pay | Admitting: Obstetrics

## 2013-08-06 ENCOUNTER — Ambulatory Visit (INDEPENDENT_AMBULATORY_CARE_PROVIDER_SITE_OTHER): Payer: Medicaid Other | Admitting: Obstetrics

## 2013-08-06 VITALS — BP 106/63 | Temp 99.5°F | Wt 146.0 lb

## 2013-08-06 DIAGNOSIS — Z1389 Encounter for screening for other disorder: Secondary | ICD-10-CM

## 2013-08-06 DIAGNOSIS — Z3480 Encounter for supervision of other normal pregnancy, unspecified trimester: Secondary | ICD-10-CM

## 2013-08-06 DIAGNOSIS — Z3689 Encounter for other specified antenatal screening: Secondary | ICD-10-CM | POA: Insufficient documentation

## 2013-08-06 DIAGNOSIS — Z3482 Encounter for supervision of other normal pregnancy, second trimester: Secondary | ICD-10-CM

## 2013-08-06 LAB — POCT URINALYSIS DIPSTICK
Bilirubin, UA: NEGATIVE
Ketones, UA: NEGATIVE
NITRITE UA: NEGATIVE
RBC UA: NEGATIVE
SPEC GRAV UA: 1.02
UROBILINOGEN UA: NEGATIVE
pH, UA: 5

## 2013-08-06 LAB — US OB DETAIL + 14 WK

## 2013-08-06 NOTE — Progress Notes (Signed)
Pulse: 81

## 2013-08-08 ENCOUNTER — Encounter (HOSPITAL_COMMUNITY): Payer: Self-pay | Admitting: Emergency Medicine

## 2013-08-08 ENCOUNTER — Emergency Department (HOSPITAL_COMMUNITY)
Admission: EM | Admit: 2013-08-08 | Discharge: 2013-08-08 | Disposition: A | Discharge status: Home / Self Care | Payer: Medicaid Other | Attending: Emergency Medicine | Admitting: Emergency Medicine

## 2013-08-08 DIAGNOSIS — Z79899 Other long term (current) drug therapy: Secondary | ICD-10-CM | POA: Insufficient documentation

## 2013-08-08 DIAGNOSIS — M26629 Arthralgia of temporomandibular joint, unspecified side: Secondary | ICD-10-CM | POA: Insufficient documentation

## 2013-08-08 DIAGNOSIS — O9989 Other specified diseases and conditions complicating pregnancy, childbirth and the puerperium: Secondary | ICD-10-CM | POA: Insufficient documentation

## 2013-08-08 NOTE — ED Notes (Signed)
Pt reports waking up this am with swelling and pain to left jaw and ear. Denies dental pain. Pt is approx [redacted] weeks pregnant. No abd complaints and good fetal movement. No distress noted at triage.

## 2013-08-08 NOTE — ED Provider Notes (Signed)
CSN: [id number]     Arrival date & time 08/08/13  1831 History  This chart was scribed for non-physician practitioner, Dierdre Forth, PA-C working with Candyce Churn, MD by Greggory Stallion, ED scribe. This patient was seen in room TR08C/TR08C and the patient's care was started at 8:26 PM.   Chief Complaint  Patient presents with  . Otalgia  . Jaw Pain   The history is provided by the patient and medical records. No language interpreter was used.   HPI Comments: Erika Decker is a 28 y.o. female that is approximately [redacted] weeks pregnant with history of TMJ who presents to the Emergency Department complaining of left jaw pain that radiates into her ear with associated swelling that started gradually this morning. She states she has had this problem before after she had her first child and was given antibiotics. Denies fever, chills, headache, neck pain,  abdominal pain, nausea, emesis, vaginal bleeding. OB-GYN is Dr. Clearance Coots.   Past Medical History  Diagnosis Date  . Abnormal Pap smear 2011    Colpo > paps nml after  . Dizziness    Past Surgical History  Procedure Laterality Date  . Colposcopy     Family History  Problem Relation Age of Onset  . Diabetes Mother   . Hypertension Mother   . Mental illness Brother   . Asthma Brother    History  Substance Use Topics  . Smoking status: Never Smoker   . Smokeless tobacco: Never Used  . Alcohol Use: No   OB History   Grav Para Term Preterm Abortions TAB SAB Ect Mult Living   4 3 3       3      Review of Systems  Constitutional: Negative for fever, chills, appetite change and fatigue.  HENT: Positive for facial swelling. Negative for congestion, ear discharge, ear pain, mouth sores, postnasal drip, rhinorrhea, sinus pressure, sore throat, tinnitus and trouble swallowing.        Jaw pain - left  Eyes: Negative for visual disturbance.  Respiratory: Negative for cough, chest tightness, shortness of breath, wheezing and stridor.    Cardiovascular: Negative for chest pain, palpitations and leg swelling.  Gastrointestinal: Negative for nausea, vomiting, abdominal pain and diarrhea.  Genitourinary: Negative for dysuria, urgency, frequency and hematuria.  Musculoskeletal: Negative for arthralgias, back pain, myalgias and neck stiffness.  Skin: Negative for rash.  Neurological: Negative for syncope, light-headedness, numbness and headaches.  Hematological: Negative for adenopathy.  Psychiatric/Behavioral: The patient is not nervous/anxious.   All other systems reviewed and are negative.    Allergies  Review of patient's allergies indicates no known allergies.  Home Medications   Current Outpatient Rx  Name  Route  Sig  Dispense  Refill  . Prenatal Vit-Fe Fumarate-FA (PRENATAL MULTIVITAMIN) TABS tablet   Oral   Take 1 tablet by mouth daily at 12 noon.          BP 104/49  Pulse 71  Temp(Src) 98 F (36.7 C) (Oral)  Resp 18  SpO2 99%  LMP 02/14/2013  Physical Exam  Nursing note and vitals reviewed. Constitutional: She is oriented to person, place, and time. She appears well-developed and well-nourished. No distress.  Awake, alert, nontoxic appearance  HENT:  Head: Normocephalic and atraumatic.  Right Ear: Tympanic membrane, external ear and ear canal normal. Tympanic membrane is not erythematous and not bulging.  Left Ear: Tympanic membrane, external ear and ear canal normal. Tympanic membrane is not erythematous and not bulging.  Nose:  No mucosal edema or rhinorrhea. No epistaxis. Right sinus exhibits no maxillary sinus tenderness and no frontal sinus tenderness. Left sinus exhibits no maxillary sinus tenderness and no frontal sinus tenderness.  Mouth/Throat: Uvula is midline, oropharynx is clear and moist and mucous membranes are normal. Mucous membranes are not pale and not cyanotic. No oropharyngeal exudate, posterior oropharyngeal edema, posterior oropharyngeal erythema or tonsillar abscesses.  Mild  swelling and tenderness to palpation of the left TMJ. No visible dental carries or dental abscess; no erythema or induration of the oral cavity. No pain to palpation of any teeth. No joint clicking. No erythema or induration of the face.  No fullness of the parotid gland.  Pain to palpation pinpoint on the left TMJ. Very mild trismus  Eyes: Conjunctivae are normal. Pupils are equal, round, and reactive to light. No scleral icterus.  Neck: Normal range of motion and full passive range of motion without pain. Neck supple.  Cardiovascular: Normal rate, regular rhythm, normal heart sounds and intact distal pulses.   No murmur heard. Pulmonary/Chest: Effort normal and breath sounds normal. No stridor. No respiratory distress. She has no wheezes.  Abdominal: Soft. Bowel sounds are normal. She exhibits no mass. There is no tenderness. There is no rebound and no guarding.  Musculoskeletal: Normal range of motion. She exhibits no edema.  Lymphadenopathy:    She has no cervical adenopathy.  Neurological: She is alert and oriented to person, place, and time.  Speech is clear and goal oriented Moves extremities without ataxia  Skin: Skin is warm and dry. No rash noted. She is not diaphoretic.  Psychiatric: She has a normal mood and affect.    ED Course  Procedures (including critical care time)  DIAGNOSTIC STUDIES: Oxygen Saturation is 99% on RA, normal by my interpretation.    COORDINATION OF CARE: 8:28 PM-Discussed treatment plan which includes ice, rest and Dental follow-up with pt at bedside and pt agreed to plan.   Labs Review Labs Reviewed - No data to display Imaging Review No results found.  EKG Interpretation   None       MDM   1. TMJ arthralgia     Erika Decker presents with history and physical consistent with TMJ arthralgia and inflammation. Patient has a history of the same. Her pain is pinpoint on the joint and she has mild trismus.  There is no evidence of dental  abscess, facial skin cellulitis or abscess or parotid gland fullness or abscess.  She is afebrile and not tachycardic.  There is no evidence of infection at this time. Likely with inflammation of the TMJ. Recommend strict followup with dentistry. She's also been given reasons to return immediately here to the emergency department including increased swelling, recurrence of redness or fever.  Patient is [redacted] weeks pregnant, she reports consistent prenatal care.  She denies abdominal pain, vaginal bleeding or discomfort.  FHT: 147  It has been determined that no acute conditions requiring further emergency intervention are present at this time. The patient/guardian have been advised of the diagnosis and plan. We have discussed signs and symptoms that warrant return to the ED, such as changes or worsening in symptoms.   Vital signs are stable at discharge.   BP 104/49  Pulse 71  Temp(Src) 98 F (36.7 C) (Oral)  Resp 18  SpO2 99%  LMP 02/14/2013  Patient/guardian has voiced understanding and agreed to follow-up with the PCP or specialist.  I personally performed the services described in this documentation, which was  scribed in my presence. The recorded information has been reviewed and is accurate.     Dahlia Client Sayyid Harewood, PA-C 08/08/13 2337

## 2013-08-08 NOTE — ED Notes (Signed)
Pt is [redacted] weeks pregnant.  

## 2013-08-08 NOTE — Discharge Instructions (Signed)
1. Medications: tylenol for pain, usual home medications 2. Treatment: rest, drink plenty of fluids, ice, soft foods 3. Follow Up: Please followup with a dentist   Temporomandibular Problems  Temporomandibular joint (TMJ) dysfunction means there are problems with the joint between your jaw and your skull. This is a joint lined by cartilage like other joints in your body but also has a small disc in the joint which keeps the bones from rubbing on each other. These joints are like other joints and can get inflamed (sore) from arthritis and other problems. When this joint gets sore, it can cause headaches and pain in the jaw and the face. CAUSES  Usually the arthritic types of problems are caused by soreness in the joint. Soreness in the joint can also be caused by overuse. This may come from grinding your teeth. It may also come from mis-alignment in the joint. DIAGNOSIS Diagnosis of this condition can often be made by history and exam. Sometimes your caregiver may need X-rays or an MRI scan to determine the exact cause. It may be necessary to see your dentist to determine if your teeth and jaws are lined up correctly. TREATMENT  Most of the time this problem is not serious; however, sometimes it can persist (become chronic). When this happens medications that will cut down on inflammation (soreness) help. Sometimes a shot of cortisone into the joint will be helpful. If your teeth are not aligned it may help for your dentist to make a splint for your mouth that can help this problem. If no physical problems can be found, the problem may come from tension. If tension is found to be the cause, biofeedback or relaxation techniques may be helpful. HOME CARE INSTRUCTIONS   Later in the day, applications of ice packs may be helpful. Ice can be used in a plastic bag with a towel around it to prevent frostbite to skin. This may be used about every 2 hours for 20 to 30 minutes, as needed while awake, or as  directed by your caregiver.  Only take over-the-counter or prescription medicines for pain, discomfort, or fever as directed by your caregiver.  If physical therapy was prescribed, follow your caregiver's directions.  Wear mouth appliances as directed if they were given. Document Released: 04/10/2001 Document Revised: 10/08/2011 Document Reviewed: 07/18/2008 [facility] Patient Information 2014 [place], [place].

## 2013-08-10 NOTE — ED Provider Notes (Signed)
Medical screening examination/treatment/procedure(s) were performed by non-physician practitioner and as supervising physician I was immediately available for consultation/collaboration.  Candyce ChurnJohn David Matteo Banke, MD 08/10/13 [phone]

## 2013-08-12 ENCOUNTER — Telehealth: Payer: Self-pay | Admitting: *Deleted

## 2013-08-12 NOTE — Telephone Encounter (Signed)
Call placed to patient.  Pt states that she has recurrent vaginal irritation that has been relieved some by use of Diflucan.  Pt states that she has now noticed bumps on her vaginal area.  Pt advised to make an appt for follow up.

## 2013-08-18 ENCOUNTER — Ambulatory Visit (INDEPENDENT_AMBULATORY_CARE_PROVIDER_SITE_OTHER): Payer: Medicaid Other | Admitting: Obstetrics

## 2013-08-18 VITALS — BP 108/66 | Temp 98.0°F | Wt 149.0 lb

## 2013-08-18 DIAGNOSIS — B3731 Acute candidiasis of vulva and vagina: Secondary | ICD-10-CM

## 2013-08-18 DIAGNOSIS — B373 Candidiasis of vulva and vagina: Secondary | ICD-10-CM

## 2013-08-18 DIAGNOSIS — Z3480 Encounter for supervision of other normal pregnancy, unspecified trimester: Secondary | ICD-10-CM

## 2013-08-18 LAB — POCT URINALYSIS DIPSTICK
Bilirubin, UA: NEGATIVE
Glucose, UA: NEGATIVE
Ketones, UA: NEGATIVE
NITRITE UA: NEGATIVE
RBC UA: NEGATIVE
Spec Grav, UA: 1.015
UROBILINOGEN UA: NEGATIVE
pH, UA: 6

## 2013-08-18 MED ORDER — FLUCONAZOLE 150 MG PO TABS
150.0000 mg | ORAL_TABLET | Freq: Once | ORAL | Status: DC
Start: 2013-08-18 — End: 2013-09-03

## 2013-08-18 MED ORDER — CLOTRIMAZOLE 1 % EX CREA: 1.0000 "application " | TOPICAL_CREAM | Freq: Two times a day (BID) | CUTANEOUS | Status: DC

## 2013-08-18 NOTE — Progress Notes (Signed)
Pulse 86 Pt states that she is having some vaginal irritation with d/c and odor.  Pt states that she has felt bumps on vaginal area.

## 2013-08-19 ENCOUNTER — Encounter: Payer: Self-pay | Admitting: Obstetrics

## 2013-08-19 LAB — GC/CHLAMYDIA PROBE AMP
CT Probe RNA: NEGATIVE
GC Probe RNA: NEGATIVE

## 2013-08-19 LAB — WET PREP BY MOLECULAR PROBE
Candida species: POSITIVE — AB
Gardnerella vaginalis: NEGATIVE
Trichomonas vaginosis: NEGATIVE

## 2013-08-20 LAB — OB RESULTS CONSOLE GC/CHLAMYDIA
Chlamydia: NEGATIVE
Gonorrhea: NEGATIVE

## 2013-09-03 ENCOUNTER — Other Ambulatory Visit: Payer: Medicaid Other

## 2013-09-03 ENCOUNTER — Ambulatory Visit (INDEPENDENT_AMBULATORY_CARE_PROVIDER_SITE_OTHER): Payer: Medicaid Other | Admitting: Obstetrics & Gynecology

## 2013-09-03 VITALS — BP 103/64 | Temp 97.7°F | Wt 148.0 lb

## 2013-09-03 DIAGNOSIS — Z3480 Encounter for supervision of other normal pregnancy, unspecified trimester: Secondary | ICD-10-CM

## 2013-09-03 LAB — POCT URINALYSIS DIPSTICK
BILIRUBIN UA: NEGATIVE
Blood, UA: NEGATIVE
GLUCOSE UA: NEGATIVE
Ketones, UA: NEGATIVE
Nitrite, UA: NEGATIVE
Protein, UA: NEGATIVE
Spec Grav, UA: 1.015
Urobilinogen, UA: NEGATIVE
pH, UA: 6

## 2013-09-03 LAB — CBC
HCT: 38.3 % (ref 36.0–46.0)
Hemoglobin: 13.2 g/dL (ref 12.0–15.0)
MCH: 29.3 pg (ref 26.0–34.0)
MCHC: 34.5 g/dL (ref 30.0–36.0)
MCV: 84.9 fL (ref 78.0–100.0)
Platelets: 237 10*3/uL (ref 150–400)
RBC: 4.51 MIL/uL (ref 3.87–5.11)
RDW: 13.8 % (ref 11.5–15.5)
WBC: 6.1 10*3/uL (ref 4.0–10.5)

## 2013-09-03 NOTE — Progress Notes (Signed)
Pulse: 76  Patient denies any concerns.  

## 2013-09-04 DIAGNOSIS — Z3480 Encounter for supervision of other normal pregnancy, unspecified trimester: Secondary | ICD-10-CM | POA: Insufficient documentation

## 2013-09-04 LAB — GLUCOSE TOLERANCE, 2 HOURS W/ 1HR
Glucose, 1 hour: 122 mg/dL (ref 70–170)
Glucose, 2 hour: 80 mg/dL (ref 70–139)
Glucose, Fasting: 69 mg/dL — ABNORMAL LOW (ref 70–99)

## 2013-09-04 LAB — RPR

## 2013-09-04 LAB — HIV ANTIBODY (ROUTINE TESTING W REFLEX): HIV: NONREACTIVE

## 2013-09-04 NOTE — Patient Instructions (Signed)
Second Trimester of Pregnancy The second trimester is from week 13 through week 28, months 4 through 6. The second trimester is often a time when you feel your best. Your body has also adjusted to being pregnant, and you begin to feel better physically. Usually, morning sickness has lessened or quit completely, you may have more energy, and you may have an increase in appetite. The second trimester is also a time when the fetus is growing rapidly. At the end of the sixth month, the fetus is about 9 inches long and weighs about 1 pounds. You will likely begin to feel the baby move (quickening) between 18 and 20 weeks of the pregnancy. BODY CHANGES Your body goes through many changes during pregnancy. The changes vary from woman to woman.   Your weight will continue to increase. You will notice your lower abdomen bulging out.  You may begin to get stretch marks on your hips, abdomen, and breasts.  You may develop headaches that can be relieved by medicines approved by your caregiver.  You may urinate more often because the fetus is pressing on your bladder.  You may develop or continue to have heartburn as a result of your pregnancy.  You may develop constipation because certain hormones are causing the muscles that push waste through your intestines to slow down.  You may develop hemorrhoids or swollen, bulging veins (varicose veins).  You may have back pain because of the weight gain and pregnancy hormones relaxing your joints between the bones in your pelvis and as a result of a shift in weight and the muscles that support your balance.  Your breasts will continue to grow and be tender.  Your gums may bleed and may be sensitive to brushing and flossing.  Dark spots or blotches (chloasma, mask of pregnancy) may develop on your face. This will likely fade after the baby is born.  A dark line from your belly button to the pubic area (linea nigra) may appear. This will likely fade after the  baby is born. WHAT TO EXPECT AT YOUR PRENATAL VISITS During a routine prenatal visit:  You will be weighed to make sure you and the fetus are growing normally.  Your blood pressure will be taken.  Your abdomen will be measured to track your baby's growth.  The fetal heartbeat will be listened to.  Any test results from the previous visit will be discussed. Your caregiver may ask you:  How you are feeling.  If you are feeling the baby move.  If you have had any abnormal symptoms, such as leaking fluid, bleeding, severe headaches, or abdominal cramping.  If you have any questions. Other tests that may be performed during your second trimester include:  Blood tests that check for:  Low iron levels (anemia).  Gestational diabetes (between 24 and 28 weeks).  Rh antibodies.  Urine tests to check for infections, diabetes, or protein in the urine.  An ultrasound to confirm the proper growth and development of the baby.  An amniocentesis to check for possible genetic problems.  Fetal screens for spina bifida and Down syndrome. HOME CARE INSTRUCTIONS   Avoid all smoking, herbs, alcohol, and unprescribed drugs. These chemicals affect the formation and growth of the baby.  Follow your caregiver's instructions regarding medicine use. There are medicines that are either safe or unsafe to take during pregnancy.  Exercise only as directed by your caregiver. Experiencing uterine cramps is a good sign to stop exercising.  Continue to eat regular,   healthy meals.  Wear a good support bra for breast tenderness.  Do not use hot tubs, steam rooms, or saunas.  Wear your seat belt at all times when driving.  Avoid raw meat, uncooked cheese, cat litter boxes, and soil used by cats. These carry germs that can cause birth defects in the baby.  Take your prenatal vitamins.  Try taking a stool softener (if your caregiver approves) if you develop constipation. Eat more high-fiber foods,  such as fresh vegetables or fruit and whole grains. Drink plenty of fluids to keep your urine clear or pale yellow.  Take warm sitz baths to soothe any pain or discomfort caused by hemorrhoids. Use hemorrhoid cream if your caregiver approves.  If you develop varicose veins, wear support hose. Elevate your feet for 15 minutes, 3 4 times a day. Limit salt in your diet.  Avoid heavy lifting, wear low heel shoes, and practice good posture.  Rest with your legs elevated if you have leg cramps or low back pain.  Visit your dentist if you have not gone yet during your pregnancy. Use a soft toothbrush to brush your teeth and be gentle when you floss.  A sexual relationship may be continued unless your caregiver directs you otherwise.  Continue to go to all your prenatal visits as directed by your caregiver. SEEK MEDICAL CARE IF:   You have dizziness.  You have mild pelvic cramps, pelvic pressure, or nagging pain in the abdominal area.  You have persistent nausea, vomiting, or diarrhea.  You have a bad smelling vaginal discharge.  You have pain with urination. SEEK IMMEDIATE MEDICAL CARE IF:   You have a fever.  You are leaking fluid from your vagina.  You have spotting or bleeding from your vagina.  You have severe abdominal cramping or pain.  You have rapid weight gain or loss.  You have shortness of breath with chest pain.  You notice sudden or extreme swelling of your face, hands, ankles, feet, or legs.  You have not felt your baby move in over an hour.  You have severe headaches that do not go away with medicine.  You have vision changes. Document Released: 07/10/2001 Document Revised: 03/18/2013 Document Reviewed: 09/16/2012 [employer] Patient Information 2014 [employer].  

## 2013-10-01 ENCOUNTER — Encounter: Payer: Medicaid Other | Admitting: Obstetrics

## 2013-10-01 ENCOUNTER — Encounter: Payer: Self-pay | Admitting: Obstetrics

## 2013-10-01 ENCOUNTER — Ambulatory Visit (INDEPENDENT_AMBULATORY_CARE_PROVIDER_SITE_OTHER): Payer: Medicaid Other | Admitting: Obstetrics

## 2013-10-01 VITALS — BP 113/62 | Temp 98.2°F | Wt 153.0 lb

## 2013-10-01 DIAGNOSIS — Z6791 Unspecified blood type, Rh negative: Secondary | ICD-10-CM

## 2013-10-01 DIAGNOSIS — O26899 Other specified pregnancy related conditions, unspecified trimester: Secondary | ICD-10-CM

## 2013-10-01 DIAGNOSIS — Z3480 Encounter for supervision of other normal pregnancy, unspecified trimester: Secondary | ICD-10-CM

## 2013-10-01 DIAGNOSIS — O360990 Maternal care for other rhesus isoimmunization, unspecified trimester, not applicable or unspecified: Secondary | ICD-10-CM

## 2013-10-01 LAB — POCT URINALYSIS DIPSTICK
Bilirubin, UA: NEGATIVE
Blood, UA: NEGATIVE
Glucose, UA: NEGATIVE
KETONES UA: NEGATIVE
LEUKOCYTES UA: NEGATIVE
Nitrite, UA: NEGATIVE
Spec Grav, UA: 1.015
Urobilinogen, UA: NEGATIVE
pH, UA: 6

## 2013-10-01 MED ORDER — RHO D IMMUNE GLOBULIN 1500 UNIT/2ML IJ SOLN
300.0000 ug | Freq: Once | INTRAMUSCULAR | Status: AC
  Administered 2013-10-01: 300 ug via INTRAMUSCULAR

## 2013-10-01 NOTE — Progress Notes (Signed)
Pulse 97 Pt states that she has been having some contractions.

## 2013-10-08 ENCOUNTER — Encounter: Payer: Self-pay | Admitting: Obstetrics

## 2013-10-08 ENCOUNTER — Ambulatory Visit (INDEPENDENT_AMBULATORY_CARE_PROVIDER_SITE_OTHER): Payer: Medicaid Other | Admitting: Obstetrics

## 2013-10-08 VITALS — BP 100/56 | Temp 98.6°F | Wt 156.0 lb

## 2013-10-08 DIAGNOSIS — Z3480 Encounter for supervision of other normal pregnancy, unspecified trimester: Secondary | ICD-10-CM

## 2013-10-08 LAB — POCT URINALYSIS DIPSTICK
Bilirubin, UA: NEGATIVE
KETONES UA: NEGATIVE
Leukocytes, UA: NEGATIVE
Nitrite, UA: NEGATIVE
RBC UA: NEGATIVE
SPEC GRAV UA: 1.02
Urobilinogen, UA: NEGATIVE
pH, UA: 6

## 2013-10-08 NOTE — Progress Notes (Signed)
Pulse: 93 Patient states she is having lower abdominal and pelvic pressure. Patient denies any concerns.

## 2013-10-11 ENCOUNTER — Inpatient Hospital Stay (HOSPITAL_COMMUNITY)
Admission: AD | Admit: 2013-10-11 | Discharge: 2013-10-11 | Disposition: A | Discharge status: Home / Self Care | Payer: Medicaid Other | Source: Ambulatory Visit | Attending: Obstetrics | Admitting: Obstetrics

## 2013-10-11 ENCOUNTER — Encounter (HOSPITAL_COMMUNITY): Payer: Self-pay

## 2013-10-11 DIAGNOSIS — N898 Other specified noninflammatory disorders of vagina: Secondary | ICD-10-CM

## 2013-10-11 DIAGNOSIS — O4700 False labor before 37 completed weeks of gestation, unspecified trimester: Secondary | ICD-10-CM | POA: Insufficient documentation

## 2013-10-11 DIAGNOSIS — Z3480 Encounter for supervision of other normal pregnancy, unspecified trimester: Secondary | ICD-10-CM

## 2013-10-11 LAB — URINALYSIS, ROUTINE W REFLEX MICROSCOPIC
Bilirubin Urine: NEGATIVE
GLUCOSE, UA: NEGATIVE mg/dL
HGB URINE DIPSTICK: NEGATIVE
Ketones, ur: NEGATIVE mg/dL
Nitrite: NEGATIVE
PH: 6 (ref 5.0–8.0)
Protein, ur: NEGATIVE mg/dL
Specific Gravity, Urine: 1.025 (ref 1.005–1.030)
Urobilinogen, UA: 1 mg/dL (ref 0.0–1.0)

## 2013-10-11 LAB — WET PREP, GENITAL
Clue Cells Wet Prep HPF POC: NONE SEEN
Trich, Wet Prep: NONE SEEN
Yeast Wet Prep HPF POC: NONE SEEN

## 2013-10-11 LAB — URINE MICROSCOPIC-ADD ON

## 2013-10-11 LAB — POCT FERN TEST: POCT Fern Test: NEGATIVE — NL

## 2013-10-11 MED ORDER — NIFEDIPINE 10 MG PO CAPS
10.0000 mg | ORAL_CAPSULE | Freq: Once | ORAL | Status: AC
  Administered 2013-10-11: 10 mg via ORAL
  Filled 2013-10-11: qty 1

## 2013-10-11 MED ORDER — NIFEDIPINE ER OSMOTIC RELEASE 30 MG PO TB24: 30.0000 mg | ORAL_TABLET | Freq: Every day | ORAL | Status: DC

## 2013-10-11 NOTE — MAU Note (Signed)
Pt presents with complaints of contractions that have been irregular throughout the day and had a large gush of fluid today while she was at work.

## 2013-10-11 NOTE — MAU Provider Note (Signed)
  History     CSN: [id number]  Arrival date and time: 10/11/13 [id number]   First Provider Initiated Contact with Patient 10/11/13 1823      Chief Complaint  Patient presents with  . Contractions  . Rupture of Membranes   HPI Erika Decker is a 28 y.o. [id number] at 5255w1d. Seh has had BH contractions off/on x 2-3 wks. While at work she had a gush x1. When checked had white mucoid discharge. Good FM, no UTI S&S or GI changes. No other changes wit discharge. Odor or itching. Last PNV 3/12- all okay, next 3/26.   OB History   Grav Para Term Preterm Abortions TAB SAB Ect Mult Living   4 3 3       3       Past Medical History  Diagnosis Date  . Abnormal Pap smear 2011    Colpo > paps nml after  . Dizziness     Past Surgical History  Procedure Laterality Date  . Colposcopy      Family History  Problem Relation Age of Onset  . Diabetes Mother   . Hypertension Mother   . Mental illness Brother   . Asthma Brother     History  Substance Use Topics  . Smoking status: Never Smoker   . Smokeless tobacco: Never Used  . Alcohol Use: No    Allergies: No Known Allergies  Prescriptions prior to admission  Medication Sig Dispense Refill  . Prenatal Vit-Fe Fumarate-FA (PRENATAL MULTIVITAMIN) TABS tablet Take 1 tablet by mouth daily at 12 noon.        Review of Systems  Constitutional: Negative for fever and chills.  Gastrointestinal: Negative for nausea, vomiting, diarrhea and constipation.  Genitourinary: Negative for dysuria, urgency and frequency.   Physical Exam   Blood pressure 111/62, pulse 93, temperature 98.1 F (36.7 C), temperature source Oral, resp. rate 18, height 5' (1.524 m), weight 155 lb (70.308 kg), last menstrual period 02/14/2013.  Physical Exam  Nursing note and vitals reviewed. Constitutional: She is oriented to person, place, and time. She appears well-developed and well-nourished.  Genitourinary:  Pelvic exam- SS Ext gen- nl anatomy,skin  intact Vagina- mod amt mucoid and thin white discharge  Cx- long, soft ext os 2-3 cm, internal os FT Uterus- gravid, ~ 30 wk size Adn- non tender  Musculoskeletal: Normal range of motion.  Neurological: She is alert and oriented to person, place, and time.  Skin: Skin is warm and dry.  Psychiatric: She has a normal mood and affect. Her behavior is normal.    MAU Course  Procedures  MDM Fern negative Pt feeling BH contractions x2-3 wks, having contractions 3-14 min- will dose with Procardia  2010 Still having contractions- less intense, less often. Consulted with Dr Clearance CootsHarper, start Procardia 30 mg XL bid. RTO 3/17 10 am  Assessment and Plan  ASSESSMENT: 29 wks IUP, not ruptured contractions     PLAN: Procardia 30 mg XL bid F/u in office Tuesday 3/17 10 am with Dr Ivery QualeHarper  Disney Ruggiero, Olegario MessierKATHY M. 10/11/2013, 6:26 PM

## 2013-10-13 ENCOUNTER — Ambulatory Visit (INDEPENDENT_AMBULATORY_CARE_PROVIDER_SITE_OTHER): Payer: Medicaid Other | Admitting: Obstetrics

## 2013-10-13 ENCOUNTER — Encounter: Payer: Self-pay | Admitting: Obstetrics

## 2013-10-13 VITALS — BP 107/63 | Temp 98.4°F | Wt 156.0 lb

## 2013-10-13 DIAGNOSIS — Z3480 Encounter for supervision of other normal pregnancy, unspecified trimester: Secondary | ICD-10-CM

## 2013-10-13 LAB — POCT URINALYSIS DIPSTICK
Bilirubin, UA: NEGATIVE
Blood, UA: NEGATIVE
KETONES UA: NEGATIVE
LEUKOCYTES UA: NEGATIVE
Nitrite, UA: NEGATIVE
PH UA: 5
SPEC GRAV UA: 1.015
Urobilinogen, UA: NEGATIVE

## 2013-10-13 NOTE — Progress Notes (Signed)
Pulse: 84 Patient states she is having lower abdominal pressure. Patient denies any concerns.

## 2013-10-22 ENCOUNTER — Encounter: Payer: Medicaid Other | Admitting: Obstetrics

## 2013-11-05 ENCOUNTER — Encounter: Payer: Medicaid Other | Admitting: Obstetrics

## 2013-11-05 ENCOUNTER — Ambulatory Visit (INDEPENDENT_AMBULATORY_CARE_PROVIDER_SITE_OTHER): Payer: Medicaid Other | Admitting: Obstetrics

## 2013-11-05 VITALS — BP 109/66 | Temp 97.9°F | Wt 157.0 lb

## 2013-11-05 DIAGNOSIS — Z3480 Encounter for supervision of other normal pregnancy, unspecified trimester: Secondary | ICD-10-CM

## 2013-11-05 LAB — POCT URINALYSIS DIPSTICK
BILIRUBIN UA: NEGATIVE
KETONES UA: NEGATIVE
Leukocytes, UA: NEGATIVE
Nitrite, UA: NEGATIVE
RBC UA: NEGATIVE
Spec Grav, UA: 1.015
Urobilinogen, UA: NEGATIVE
pH, UA: 6

## 2013-11-05 NOTE — Progress Notes (Signed)
Pulse: 84 Patient states she is having lower abdominal and pelvic pressure. Patient would like to make sure she is not dilated.

## 2013-11-17 ENCOUNTER — Encounter: Payer: Self-pay | Admitting: Obstetrics

## 2013-11-18 ENCOUNTER — Ambulatory Visit (INDEPENDENT_AMBULATORY_CARE_PROVIDER_SITE_OTHER): Payer: Medicaid Other | Admitting: Obstetrics

## 2013-11-18 VITALS — Temp 98.5°F | Wt 153.0 lb

## 2013-11-18 DIAGNOSIS — Z3480 Encounter for supervision of other normal pregnancy, unspecified trimester: Secondary | ICD-10-CM

## 2013-11-18 LAB — POCT URINALYSIS DIPSTICK
Bilirubin, UA: NEGATIVE
Blood, UA: NEGATIVE
GLUCOSE UA: NEGATIVE
Ketones, UA: NEGATIVE
Leukocytes, UA: NEGATIVE
Nitrite, UA: NEGATIVE
PH UA: 5
SPEC GRAV UA: 1.015
Urobilinogen, UA: NEGATIVE

## 2013-11-19 ENCOUNTER — Encounter: Payer: Self-pay | Admitting: Obstetrics

## 2013-11-19 ENCOUNTER — Encounter: Payer: Medicaid Other | Admitting: Obstetrics

## 2013-11-19 NOTE — Progress Notes (Signed)
Subjective:    Erika Decker is a 28 y.o. female being seen today for her obstetrical visit. She is at 1944w5d gestation. Patient reports no complaints. Fetal movement: normal.   Past Medical History  Diagnosis Date  . Abnormal Pap smear 2011    Colpo > paps nml after  . Dizziness     Past Surgical History  Procedure Laterality Date  . Colposcopy      Current outpatient prescriptions:NIFEdipine (PROCARDIA-XL/ADALAT-CC/NIFEDICAL-XL) 30 MG 24 hr tablet, Take 1 tablet (30 mg total) by mouth daily., Disp: 60 tablet, Rfl: 0;  Prenatal Vit-Fe Fumarate-FA (PRENATAL MULTIVITAMIN) TABS tablet, Take 1 tablet by mouth daily at 12 noon., Disp: , Rfl:  No current facility-administered medications for this visit. Facility-Administered Medications Ordered in Other Visits: rho(d) immune globulin (RHIG/Rhophylac) injection 300 mcg, 300 mcg, Intramuscular, Once, Antionette CharLisa Jackson-Moore, MD No Known Allergies  History  Substance Use Topics  . Smoking status: Never Smoker   . Smokeless tobacco: Never Used  . Alcohol Use: No    Family History  Problem Relation Age of Onset  . Diabetes Mother   . Hypertension Mother   . Mental illness Brother   . Asthma Brother      Review of Systems Constitutional: negative for anorexia Gastrointestinal: negative for abdominal pain Genitourinary:negative for vaginal discharge Musculoskeletal:negative for back pain Behavioral/Psych: negative for depression and tobacco use   Objective:    Temp(Src) 98.5 F (36.9 C)  Wt 153 lb (69.4 kg)  LMP 02/14/2013 FHT:  150 BPM  Uterine Size: size equals dates  Presentation: unsure     Assessment:    Pregnancy @ 3444w5d weeks   Plan:     labs reviewed, problem list updated Consent signed. GBS sent TDAP offered  Rhogam given for RH negative Pediatrician: discussed. Infant feeding: plans to breastfeed. Maternity leave: discussed. Cigarette smoking: never smoked. Orders Placed This Encounter  Procedures  . POCT  urinalysis dipstick   No orders of the defined types were placed in this encounter.   Follow up in 1 Week.

## 2013-11-26 ENCOUNTER — Ambulatory Visit (INDEPENDENT_AMBULATORY_CARE_PROVIDER_SITE_OTHER): Payer: Medicaid Other | Admitting: Obstetrics

## 2013-11-26 ENCOUNTER — Encounter: Payer: Self-pay | Admitting: Obstetrics

## 2013-11-26 VITALS — BP 114/71 | HR 90 | Wt 158.0 lb

## 2013-11-26 DIAGNOSIS — Z3480 Encounter for supervision of other normal pregnancy, unspecified trimester: Secondary | ICD-10-CM

## 2013-11-26 LAB — POCT URINALYSIS DIPSTICK
BILIRUBIN UA: NEGATIVE
Blood, UA: NEGATIVE
KETONES UA: NEGATIVE
Nitrite, UA: NEGATIVE
Spec Grav, UA: 1.02
Urobilinogen, UA: NEGATIVE
pH, UA: 5

## 2013-11-26 NOTE — Progress Notes (Signed)
Subjective:    Erika Decker is a 28 y.o. female being seen today for her obstetrical visit. She is at 5331w5d gestation. Patient reports no complaints. Fetal movement: normal.  Problem List Items Addressed This Visit   Supervision of other normal pregnancy - Primary   Relevant Orders      POCT urinalysis dipstick (Completed)     Patient Active Problem List   Diagnosis Date Noted  . Supervision of other normal pregnancy 09/04/2013  . Lump or mass in breast 04/30/2013   Objective:    BP 114/71  Pulse 90  Wt 158 lb (71.668 kg)  LMP 02/14/2013 FHT:  150 BPM  Uterine Size: size equals dates  Presentation: unsure     Assessment:    Pregnancy @ 7231w5d weeks   Plan:     labs reviewed, problem list updated Consent signed. GBS sent TDAP offered  Rhogam given for RH negative Pediatrician: discussed. Infant feeding: plans to breastfeed. Maternity leave: not discussed. Cigarette smoking: never smoked. Orders Placed This Encounter  Procedures  . POCT urinalysis dipstick   No orders of the defined types were placed in this encounter.   Follow up in 1 Week.

## 2013-11-26 NOTE — Addendum Note (Signed)
Addended by: Odessa FlemingBOHNE, Tayvion Lauder M on: 11/26/2013 05:08 PM   Modules accepted: Orders

## 2013-11-28 LAB — STREP B DNA PROBE: STREP GROUP B AG: POSITIVE

## 2013-12-03 ENCOUNTER — Ambulatory Visit (INDEPENDENT_AMBULATORY_CARE_PROVIDER_SITE_OTHER): Payer: Medicaid Other | Admitting: Obstetrics

## 2013-12-03 ENCOUNTER — Encounter: Payer: Self-pay | Admitting: Obstetrics

## 2013-12-03 VITALS — BP 118/70 | HR 96 | Temp 97.9°F | Wt 158.0 lb

## 2013-12-03 DIAGNOSIS — Z3480 Encounter for supervision of other normal pregnancy, unspecified trimester: Secondary | ICD-10-CM

## 2013-12-03 LAB — POCT URINALYSIS DIPSTICK
Blood, UA: NEGATIVE
Glucose, UA: 250
Ketones, UA: NEGATIVE
Nitrite, UA: NEGATIVE
PH UA: 6
Protein, UA: NEGATIVE
Spec Grav, UA: 1.01

## 2013-12-03 NOTE — Progress Notes (Signed)
Subjective:    Erika Decker is a 28 y.o. female being seen today for her obstetrical visit. She is at 2386w5d gestation. Patient reports no complaints. Fetal movement: normal.  Problem List Items Addressed This Visit   Supervision of other normal pregnancy - Primary   Relevant Orders      POCT urinalysis dipstick (Completed)     Patient Active Problem List   Diagnosis Date Noted  . Supervision of other normal pregnancy 09/04/2013  . Lump or mass in breast 04/30/2013   Objective:    BP 118/70  Pulse 96  Temp(Src) 97.9 F (36.6 C)  Wt 158 lb (71.668 kg)  LMP 02/14/2013 FHT:  150 BPM  Uterine Size: size equals dates  Presentation: unsure     Assessment:    Pregnancy @ 7386w5d weeks   Plan:     labs reviewed, problem list updated Consent signed. GBS sent TDAP offered  Rhogam given for RH negative Pediatrician: discussed. Infant feeding: plans to breastfeed. Maternity leave: not discussed. Cigarette smoking: never smoked. Orders Placed This Encounter  Procedures  . POCT urinalysis dipstick   No orders of the defined types were placed in this encounter.   Follow up in 1 Week.

## 2013-12-10 ENCOUNTER — Encounter: Payer: Self-pay | Admitting: Obstetrics

## 2013-12-10 ENCOUNTER — Ambulatory Visit (INDEPENDENT_AMBULATORY_CARE_PROVIDER_SITE_OTHER): Payer: Medicaid Other | Admitting: Obstetrics

## 2013-12-10 VITALS — BP 99/63 | HR 90 | Temp 98.1°F | Wt 156.0 lb

## 2013-12-10 DIAGNOSIS — Z3480 Encounter for supervision of other normal pregnancy, unspecified trimester: Secondary | ICD-10-CM

## 2013-12-10 LAB — POCT URINALYSIS DIPSTICK
GLUCOSE UA: 50
Ketones, UA: NEGATIVE
LEUKOCYTES UA: NEGATIVE
NITRITE UA: NEGATIVE
Protein, UA: NEGATIVE
RBC UA: NEGATIVE
Spec Grav, UA: 1.015
pH, UA: 6

## 2013-12-10 NOTE — Progress Notes (Signed)
Subjective:    Erika Decker is a 28 y.o. female being seen today for her obstetrical visit. She is at 6376w5d gestation. Patient reports no complaints. Fetal movement: normal.  Problem List Items Addressed This Visit   Supervision of other normal pregnancy - Primary   Relevant Orders      POCT urinalysis dipstick     Patient Active Problem List   Diagnosis Date Noted  . Supervision of other normal pregnancy 09/04/2013  . Lump or mass in breast 04/30/2013    Objective:    BP 99/63  Pulse 90  Temp(Src) 98.1 F (36.7 C)  Wt 156 lb (70.761 kg)  LMP 02/14/2013 FHT: 150 BPM  Uterine Size: size equals dates  Presentations: unsure  Pelvic Exam:  Deferred    Assessment:    Pregnancy @ 576w5d weeks   Plan:   Plans for delivery: Vaginal anticipated; labs reviewed; problem list updated Counseling: Consent signed. Infant feeding: plans to breastfeed. Cigarette smoking: never smoked. L&D discussion: symptoms of labor, discussed when to call, discussed what number to call, anesthetic/analgesic options reviewed and delivering clinician:  plans Physician. Postpartum supports and preparation: circumcision discussed and contraception plans discussed.  Follow up in 1 Week.

## 2013-12-17 ENCOUNTER — Encounter: Payer: Self-pay | Admitting: Obstetrics

## 2013-12-17 ENCOUNTER — Ambulatory Visit (INDEPENDENT_AMBULATORY_CARE_PROVIDER_SITE_OTHER): Payer: Medicaid Other | Admitting: Obstetrics

## 2013-12-17 VITALS — BP 115/67 | HR 90 | Temp 98.2°F | Wt 160.0 lb

## 2013-12-17 DIAGNOSIS — Z3480 Encounter for supervision of other normal pregnancy, unspecified trimester: Secondary | ICD-10-CM

## 2013-12-17 LAB — POCT URINALYSIS DIPSTICK
Glucose, UA: 100
Ketones, UA: 1
Nitrite, UA: NEGATIVE
PROTEIN UA: NEGATIVE
RBC UA: NEGATIVE
Spec Grav, UA: 1.015
pH, UA: 6

## 2013-12-17 NOTE — Progress Notes (Signed)
Subjective:    Erika Decker is a 28 y.o. female being seen today for her obstetrical visit. She is at 8311w5d gestation. Patient reports no complaints. Fetal movement: normal.  Problem List Items Addressed This Visit   Supervision of other normal pregnancy - Primary   Relevant Orders      POCT urinalysis dipstick (Completed)     Patient Active Problem List   Diagnosis Date Noted  . Supervision of other normal pregnancy 09/04/2013  . Lump or mass in breast 04/30/2013    Objective:    BP 115/67  Pulse 90  Temp(Src) 98.2 F (36.8 C)  Wt 160 lb (72.576 kg)  LMP 02/14/2013 FHT: 150 BPM  Uterine Size: size equals dates  Presentations: cephalic  Pelvic Exam:              Dilation: 2cm       Effacement: 50%             Station:  -2    Consistency: soft            Position: posterior     Assessment:    Pregnancy @ 411w5d weeks   Plan:   Plans for delivery: Vaginal anticipated; labs reviewed; problem list updated Counseling: Consent signed. Infant feeding: plans to breastfeed. Cigarette smoking: never smoked. L&D discussion: symptoms of labor, discussed when to call, discussed what number to call, anesthetic/analgesic options reviewed and delivering clinician:  plans Physician. Postpartum supports and preparation: circumcision discussed and contraception plans discussed.  Follow up in 1 Week.

## 2013-12-24 ENCOUNTER — Encounter: Payer: Self-pay | Admitting: Obstetrics

## 2013-12-24 ENCOUNTER — Ambulatory Visit (INDEPENDENT_AMBULATORY_CARE_PROVIDER_SITE_OTHER): Payer: Medicaid Other | Admitting: Obstetrics

## 2013-12-24 VITALS — BP 115/75 | HR 85 | Temp 98.4°F | Wt 158.0 lb

## 2013-12-24 DIAGNOSIS — Z3480 Encounter for supervision of other normal pregnancy, unspecified trimester: Secondary | ICD-10-CM

## 2013-12-24 LAB — POCT URINALYSIS DIPSTICK
Blood, UA: NEGATIVE
Glucose, UA: 50
KETONES UA: NEGATIVE
Nitrite, UA: NEGATIVE
PROTEIN UA: NEGATIVE
SPEC GRAV UA: 1.01
pH, UA: 6

## 2013-12-24 NOTE — Progress Notes (Signed)
Subjective:    Erika Decker is a 28 y.o. female being seen today for her obstetrical visit. She is at [redacted]w[redacted]d gestation. Patient reports no complaints. Fetal movement: normal.  Problem List Items Addressed This Visit   Supervision of other normal pregnancy - Primary   Relevant Orders      POCT urinalysis dipstick     Patient Active Problem List   Diagnosis Date Noted  . Supervision of other normal pregnancy 09/04/2013  . Lump or mass in breast 04/30/2013    Objective:    BP 115/75  Pulse 85  Temp(Src) 98.4 F (36.9 C)  Wt 158 lb (71.668 kg)  LMP 02/14/2013 FHT: 150 BPM  Uterine Size: size equals dates  Presentations: cephalic  Pelvic Exam:  Deferred    Assessment:    Pregnancy @ [redacted]w[redacted]d weeks   Plan:   Plans for delivery: Vaginal anticipated; labs reviewed; problem list updated Counseling: Consent signed. Infant feeding: plans to breastfeed. Cigarette smoking: quit date January 2015. L&D discussion: symptoms of labor, discussed when to call, discussed what number to call, anesthetic/analgesic options reviewed and delivering clinician:  plans Physician. Postpartum supports and preparation: circumcision discussed and contraception plans discussed.  Follow up in 1 Week.

## 2013-12-24 NOTE — Progress Notes (Signed)
Patient reports she is doing well- awaiting labor.

## 2013-12-31 ENCOUNTER — Encounter: Payer: Self-pay | Admitting: Obstetrics

## 2013-12-31 ENCOUNTER — Telehealth (HOSPITAL_COMMUNITY): Payer: Self-pay | Admitting: *Deleted

## 2013-12-31 ENCOUNTER — Encounter: Payer: Medicaid Other | Admitting: Obstetrics

## 2013-12-31 ENCOUNTER — Ambulatory Visit (INDEPENDENT_AMBULATORY_CARE_PROVIDER_SITE_OTHER): Payer: Medicaid Other | Admitting: Obstetrics

## 2013-12-31 VITALS — BP 111/67 | HR 91 | Temp 98.1°F | Wt 160.0 lb

## 2013-12-31 DIAGNOSIS — O480 Post-term pregnancy: Secondary | ICD-10-CM

## 2013-12-31 DIAGNOSIS — Z3480 Encounter for supervision of other normal pregnancy, unspecified trimester: Secondary | ICD-10-CM

## 2013-12-31 LAB — POCT URINALYSIS DIPSTICK
BILIRUBIN UA: NEGATIVE
Glucose, UA: 50
Leukocytes, UA: NEGATIVE
Nitrite, UA: NEGATIVE
PH UA: 6
Protein, UA: NEGATIVE
RBC UA: NEGATIVE
Spec Grav, UA: 1.01
Urobilinogen, UA: NEGATIVE

## 2013-12-31 NOTE — Telephone Encounter (Signed)
Preadmission screen  

## 2013-12-31 NOTE — Progress Notes (Signed)
Subjective:    Erika Decker is a 28 y.o. female being seen today for her obstetrical visit. She is at [redacted]w[redacted]d gestation. Patient reports no complaints. Fetal movement: normal.  Problem List Items Addressed This Visit   Supervision of other normal pregnancy - Primary   Relevant Orders      POCT urinalysis dipstick (Completed)     Patient Active Problem List   Diagnosis Date Noted  . Supervision of other normal pregnancy 09/04/2013  . Lump or mass in breast 04/30/2013    Objective:    BP 111/67  Pulse 91  Temp(Src) 98.1 F (36.7 C)  Wt 160 lb (72.576 kg)  LMP 02/14/2013 FHT:  150 BPM  Uterine Size: size equals dates  Presentation: cephalic  Pelvic Exam:              Dilation: 3cm       Effacement: 75%   Station:  -2     Consistency: soft            Position: middle     Assessment:    Pregnancy @ [redacted]w[redacted]d  weeks     Plan:    Postdates management: discussed fetal surveillance and induction, discussed fetal movement, NST reactive, biophysical profile ordered. Induction: scheduled for 01-03-14, written information given.

## 2014-01-01 ENCOUNTER — Encounter (HOSPITAL_COMMUNITY): Payer: Self-pay | Admitting: *Deleted

## 2014-01-01 ENCOUNTER — Telehealth (HOSPITAL_COMMUNITY): Payer: Self-pay | Admitting: *Deleted

## 2014-01-01 NOTE — Telephone Encounter (Signed)
Preadmission screen  

## 2014-01-03 ENCOUNTER — Inpatient Hospital Stay (HOSPITAL_COMMUNITY)
Admission: RE | Admit: 2014-01-03 | Discharge: 2014-01-05 | DRG: 775 | Disposition: A | Discharge status: Home / Self Care | Payer: Medicaid Other | Source: Ambulatory Visit | Attending: Obstetrics | Admitting: Obstetrics

## 2014-01-03 ENCOUNTER — Encounter (HOSPITAL_COMMUNITY): Payer: Self-pay

## 2014-01-03 VITALS — BP 110/65 | HR 79 | Temp 98.7°F | Resp 18 | Ht 60.0 in | Wt 160.0 lb

## 2014-01-03 DIAGNOSIS — O9989 Other specified diseases and conditions complicating pregnancy, childbirth and the puerperium: Secondary | ICD-10-CM

## 2014-01-03 DIAGNOSIS — Z22330 Carrier of Group B streptococcus: Secondary | ICD-10-CM

## 2014-01-03 DIAGNOSIS — Z3480 Encounter for supervision of other normal pregnancy, unspecified trimester: Secondary | ICD-10-CM

## 2014-01-03 DIAGNOSIS — O480 Post-term pregnancy: Principal | ICD-10-CM | POA: Diagnosis present

## 2014-01-03 DIAGNOSIS — Z833 Family history of diabetes mellitus: Secondary | ICD-10-CM

## 2014-01-03 DIAGNOSIS — Z8249 Family history of ischemic heart disease and other diseases of the circulatory system: Secondary | ICD-10-CM

## 2014-01-03 DIAGNOSIS — O99892 Other specified diseases and conditions complicating childbirth: Secondary | ICD-10-CM | POA: Diagnosis present

## 2014-01-03 LAB — CBC
HEMATOCRIT: 36.1 % (ref 36.0–46.0)
Hemoglobin: 12.4 g/dL (ref 12.0–15.0)
MCH: 29 pg (ref 26.0–34.0)
MCHC: 34.3 g/dL (ref 30.0–36.0)
MCV: 84.3 fL (ref 78.0–100.0)
Platelets: 201 10*3/uL (ref 150–400)
RBC: 4.28 MIL/uL (ref 3.87–5.11)
RDW: 13.4 % (ref 11.5–15.5)
WBC: 7.4 10*3/uL (ref 4.0–10.5)

## 2014-01-03 LAB — RPR

## 2014-01-03 MED ORDER — TERBUTALINE SULFATE 1 MG/ML IJ SOLN: 0.2500 mg | Freq: Once | INTRAMUSCULAR | Status: DC | PRN

## 2014-01-03 MED ORDER — LACTATED RINGERS IV SOLN: 500.0000 mL | INTRAVENOUS | Status: DC | PRN

## 2014-01-03 MED ORDER — DIBUCAINE 1 % RE OINT: 1.0000 "application " | TOPICAL_OINTMENT | RECTAL | Status: DC | PRN

## 2014-01-03 MED ORDER — LIDOCAINE HCL (PF) 1 % IJ SOLN
30.0000 mL | INTRAMUSCULAR | Status: DC | PRN
  Administered 2014-01-03: 30 mL via SUBCUTANEOUS
  Filled 2014-01-03: qty 30

## 2014-01-03 MED ORDER — ONDANSETRON HCL 4 MG/2ML IJ SOLN: 4.0000 mg | INTRAMUSCULAR | Status: DC | PRN

## 2014-01-03 MED ORDER — ONDANSETRON HCL 4 MG PO TABS: 4.0000 mg | ORAL_TABLET | ORAL | Status: DC | PRN

## 2014-01-03 MED ORDER — OXYTOCIN 40 UNITS IN LACTATED RINGERS INFUSION - SIMPLE MED: 62.5000 mL/h | INTRAVENOUS | Status: DC | PRN

## 2014-01-03 MED ORDER — SIMETHICONE 80 MG PO CHEW: 80.0000 mg | CHEWABLE_TABLET | ORAL | Status: DC | PRN

## 2014-01-03 MED ORDER — ONDANSETRON HCL 4 MG/2ML IJ SOLN: 4.0000 mg | Freq: Four times a day (QID) | INTRAMUSCULAR | Status: DC | PRN

## 2014-01-03 MED ORDER — OXYTOCIN 40 UNITS IN LACTATED RINGERS INFUSION - SIMPLE MED
1.0000 m[IU]/min | INTRAVENOUS | Status: DC
  Administered 2014-01-03: 1 m[IU]/min via INTRAVENOUS

## 2014-01-03 MED ORDER — FLEET ENEMA 7-19 GM/118ML RE ENEM: 1.0000 | ENEMA | RECTAL | Status: DC | PRN

## 2014-01-03 MED ORDER — IBUPROFEN 600 MG PO TABS
600.0000 mg | ORAL_TABLET | Freq: Four times a day (QID) | ORAL | Status: DC | PRN
  Administered 2014-01-03: 600 mg via ORAL
  Filled 2014-01-03: qty 1

## 2014-01-03 MED ORDER — OXYCODONE-ACETAMINOPHEN 5-325 MG PO TABS
1.0000 | ORAL_TABLET | ORAL | Status: DC | PRN
  Administered 2014-01-03: 2 via ORAL
  Filled 2014-01-03: qty 2

## 2014-01-03 MED ORDER — PENICILLIN G POTASSIUM 5000000 UNITS IJ SOLR
2.5000 10*6.[IU] | INTRAVENOUS | Status: DC
  Administered 2014-01-03 (×2): 2.5 10*6.[IU] via INTRAVENOUS
  Filled 2014-01-03 (×5): qty 2.5

## 2014-01-03 MED ORDER — TETANUS-DIPHTH-ACELL PERTUSSIS 5-2.5-18.5 LF-MCG/0.5 IM SUSP: 0.5000 mL | Freq: Once | INTRAMUSCULAR | Status: DC

## 2014-01-03 MED ORDER — IBUPROFEN 600 MG PO TABS
600.0000 mg | ORAL_TABLET | Freq: Four times a day (QID) | ORAL | Status: DC
  Administered 2014-01-04 – 2014-01-05 (×7): 600 mg via ORAL
  Filled 2014-01-03 (×7): qty 1

## 2014-01-03 MED ORDER — OXYCODONE-ACETAMINOPHEN 5-325 MG PO TABS: 1.0000 | ORAL_TABLET | ORAL | Status: DC | PRN

## 2014-01-03 MED ORDER — BENZOCAINE-MENTHOL 20-0.5 % EX AERO
1.0000 "application " | INHALATION_SPRAY | CUTANEOUS | Status: DC | PRN
  Filled 2014-01-03: qty 56

## 2014-01-03 MED ORDER — OXYTOCIN 40 UNITS IN LACTATED RINGERS INFUSION - SIMPLE MED
62.5000 mL/h | INTRAVENOUS | Status: DC
  Filled 2014-01-03: qty 1000

## 2014-01-03 MED ORDER — ACETAMINOPHEN 325 MG PO TABS: 650.0000 mg | ORAL_TABLET | ORAL | Status: DC | PRN

## 2014-01-03 MED ORDER — CITRIC ACID-SODIUM CITRATE 334-500 MG/5ML PO SOLN: 30.0000 mL | ORAL | Status: DC | PRN

## 2014-01-03 MED ORDER — MEDROXYPROGESTERONE ACETATE 150 MG/ML IM SUSP: 150.0000 mg | INTRAMUSCULAR | Status: DC | PRN

## 2014-01-03 MED ORDER — SENNOSIDES-DOCUSATE SODIUM 8.6-50 MG PO TABS
2.0000 | ORAL_TABLET | ORAL | Status: DC
  Administered 2014-01-04 – 2014-01-05 (×2): 2 via ORAL
  Filled 2014-01-03 (×2): qty 2

## 2014-01-03 MED ORDER — BUTORPHANOL TARTRATE 1 MG/ML IJ SOLN
1.0000 mg | INTRAMUSCULAR | Status: DC | PRN
  Administered 2014-01-03: 1 mg via INTRAVENOUS
  Filled 2014-01-03: qty 1

## 2014-01-03 MED ORDER — PRENATAL MULTIVITAMIN CH
1.0000 | ORAL_TABLET | Freq: Every day | ORAL | Status: DC
  Administered 2014-01-04 – 2014-01-05 (×2): 1 via ORAL
  Filled 2014-01-03 (×2): qty 1

## 2014-01-03 MED ORDER — WITCH HAZEL-GLYCERIN EX PADS: 1.0000 "application " | MEDICATED_PAD | CUTANEOUS | Status: DC | PRN

## 2014-01-03 MED ORDER — LACTATED RINGERS IV SOLN
INTRAVENOUS | Status: DC
  Administered 2014-01-03 (×2): via INTRAVENOUS

## 2014-01-03 MED ORDER — ZOLPIDEM TARTRATE 5 MG PO TABS: 5.0000 mg | ORAL_TABLET | Freq: Every evening | ORAL | Status: DC | PRN

## 2014-01-03 MED ORDER — PENICILLIN G POTASSIUM 5000000 UNITS IJ SOLR
5.0000 10*6.[IU] | Freq: Once | INTRAVENOUS | Status: AC
  Administered 2014-01-03: 5 10*6.[IU] via INTRAVENOUS
  Filled 2014-01-03: qty 5

## 2014-01-03 MED ORDER — DIPHENHYDRAMINE HCL 25 MG PO CAPS: 25.0000 mg | ORAL_CAPSULE | Freq: Four times a day (QID) | ORAL | Status: DC | PRN

## 2014-01-03 MED ORDER — LANOLIN HYDROUS EX OINT: TOPICAL_OINTMENT | CUTANEOUS | Status: DC | PRN

## 2014-01-03 MED ORDER — OXYTOCIN BOLUS FROM INFUSION: 500.0000 mL | INTRAVENOUS | Status: DC

## 2014-01-03 NOTE — H&P (Signed)
Erika Decker is a 28 y.o. female presenting for IOL for postdates. Maternal Medical History:  Fetal activity: Perceived fetal activity is normal.   Last perceived fetal movement was within the past hour.    Prenatal complications: no prenatal complications Prenatal Complications - Diabetes: none.    OB History   Grav Para Term Preterm Abortions TAB SAB Ect Mult Living   4 3 3       3      Past Medical History  Diagnosis Date  . Abnormal Pap smear 2011    Colpo > paps nml after  . Dizziness   . Vaginal Pap smear, abnormal    Past Surgical History  Procedure Laterality Date  . Colposcopy     Family History: family history includes Asthma in her brother; Birth defects in her brother; Depression in her brother; Diabetes in her mother; Hypertension in her mother. Social History:  reports that she has never smoked. She has never used smokeless tobacco. She reports that she does not drink alcohol or use illicit drugs.   Prenatal Transfer Tool  Maternal Diabetes: No Genetic Screening: Normal Maternal Ultrasounds/Referrals: Normal Fetal Ultrasounds or other Referrals:  None Maternal Substance Abuse:  No Significant Maternal Medications:  None Significant Maternal Lab Results:  Lab values include: Group B Strep positive Other Comments:  None  Review of Systems  All other systems reviewed and are negative.   Dilation: 4.5 Effacement (%): 70 Station: -2 Exam by:: J.Cox, RN Blood pressure 110/66, pulse 59, height 5' (1.524 m), weight 160 lb (72.576 kg), last menstrual period 02/14/2013. Maternal Exam:  Abdomen: Patient reports no abdominal tenderness. Fetal presentation: vertex  Pelvis: adequate for delivery.   Cervix: Cervix evaluated by digital exam.     Physical Exam  Nursing note and vitals reviewed. Constitutional: She is oriented to person, place, and time. She appears well-developed and well-nourished.  HENT:  Head: Normocephalic and atraumatic.  Eyes:  Conjunctivae are normal. Pupils are equal, round, and reactive to light.  Neck: Normal range of motion. Neck supple.  Cardiovascular: Normal rate and regular rhythm.   Respiratory: Effort normal and breath sounds normal.  GI: Soft.  Genitourinary: Vagina normal and uterus normal.  Musculoskeletal: Normal range of motion.  Neurological: She is alert and oriented to person, place, and time.  Skin: Skin is warm and dry.  Psychiatric: She has a normal mood and affect. Her behavior is normal. Judgment and thought content normal.    Prenatal labs: ABO, Rh: A/NEG/-- (11/11 9798) Antibody: NEG (11/11 0952) Rubella: 0.54 (11/11 0952) RPR: NON REAC (02/05 1212)  HBsAg: NEGATIVE (11/11 0952)  HIV: NON REACTIVE (02/05 1212)  GBS: POSITIVE (04/30 1717)   Assessment/Plan: 41 weeks.  IOL.   [name] 01/03/2014, 11:51 AM

## 2014-01-03 NOTE — Progress Notes (Signed)
Erika Decker is a 28 y.o. [phone] at [redacted]w[redacted]d by LMP admitted for induction of labor due to Post dates. Due date 01-02-14.  Subjective:   Objective: BP 110/66  Pulse 59  Ht 5' (1.524 m)  Wt 160 lb (72.576 kg)  BMI 31.25 kg/m2  LMP 02/14/2013      FHT:  FHR: 140 bpm, variability: moderate,  accelerations:  Present,  decelerations:  Absent UC:   regular, every 1-4 minutes SVE:   Dilation: 4.5 Effacement (%): 70 Station: -2 Exam by:: J.Cox, RN  Labs: Lab Results  Component Value Date   WBC 7.4 01/03/2014   HGB 12.4 01/03/2014   HCT 36.1 01/03/2014   MCV 84.3 01/03/2014   PLT 201 01/03/2014    Assessment / Plan: Induction of labor due to postdates,  progressing well on pitocin  Labor: Progressing on Pitocin, will continue to increase then AROM Preeclampsia:  n/a Fetal Wellbeing:  Category I Pain Control:  Labor support without medications I/D:  n/a Anticipated MOD:  NSVD  [name] 01/03/2014, 11:57 AM

## 2014-01-04 LAB — CBC
HEMATOCRIT: 34.8 % — AB (ref 36.0–46.0)
HEMOGLOBIN: 11.6 g/dL — AB (ref 12.0–15.0)
MCH: 28.3 pg (ref 26.0–34.0)
MCHC: 33.3 g/dL (ref 30.0–36.0)
MCV: 84.9 fL (ref 78.0–100.0)
Platelets: 184 10*3/uL (ref 150–400)
RBC: 4.1 MIL/uL (ref 3.87–5.11)
RDW: 13.4 % (ref 11.5–15.5)
WBC: 11 10*3/uL — AB (ref 4.0–10.5)

## 2014-01-04 MED ORDER — RHO D IMMUNE GLOBULIN 1500 UNIT/2ML IJ SOSY
300.0000 ug | PREFILLED_SYRINGE | Freq: Once | INTRAMUSCULAR | Status: AC
  Administered 2014-01-04: 300 ug via INTRAMUSCULAR
  Filled 2014-01-04: qty 2

## 2014-01-04 NOTE — Lactation Note (Signed)
This note was copied from the chart of Erika Genita Stubler. Lactation Consultation Note  P4, Baby sleeping. Mother states baby likes to breastfeed on right only.  Encouraged her to keep trying left.  Try hand expression and massage before feeding. Provided mother with a hand pump, reviewed use.  Mother states she has been taught hand expression. Encouraged her to do STS and hand expression often.  Reviewed basics, supply and demand, cluster feeding. Mom made aware of O/P services, breastfeeding support groups, community resources, and our phone # for post-discharge questions.    Patient Name: Erika Decker [facility] Date: 01/04/2014 Reason for consult: Initial assessment   Maternal Data Has patient been taught Hand Expression?: Yes  Feeding Feeding Type: Breast Fed  LATCH Score/Interventions                      Lactation Tools Discussed/Used     Consult Status Consult Status: Follow-up Date: 01/05/14 Follow-up type: In-patient    Dulce Sellar Berkelhammer 01/04/2014, 11:21 AM

## 2014-01-04 NOTE — Progress Notes (Signed)
Called at 1435 to check on pt's rhogam. Lab still working on rhogam.

## 2014-01-04 NOTE — Progress Notes (Signed)
Post Partum Day 1 Subjective: no complaints  Objective: Blood pressure 103/60, pulse 81, temperature 97.5 F (36.4 C), temperature source Oral, resp. rate 18, height 5' (1.524 m), weight 160 lb (72.576 kg), last menstrual period 02/14/2013, SpO2 98.00%, unknown if currently breastfeeding.  Physical Exam:  General: alert and no distress Lochia: appropriate Uterine Fundus: firm Incision: healing well DVT Evaluation: No evidence of DVT seen on physical exam.   Recent Labs  01/03/14 0740 01/04/14 0605  HGB 12.4 11.6*  HCT 36.1 34.8*    Assessment/Plan: Plan for discharge tomorrow   LOS: 1 day   [name] 01/04/2014, 6:35 AM

## 2014-01-04 NOTE — Progress Notes (Signed)
Ur chart review completed.  

## 2014-01-05 LAB — RH IG WORKUP (INCLUDES ABO/RH)
ABO/RH(D): A NEG
ANTIBODY SCREEN: POSITIVE
DAT, IGG: NEGATIVE
Fetal Screen: NEGATIVE
Gestational Age(Wks): 41.1
Unit division: 0

## 2014-01-05 MED ORDER — IBUPROFEN 600 MG PO TABS: 600.0000 mg | ORAL_TABLET | Freq: Four times a day (QID) | ORAL | Status: DC

## 2014-01-05 MED ORDER — TETANUS-DIPHTH-ACELL PERTUSSIS 5-2.5-18.5 LF-MCG/0.5 IM SUSP
0.5000 mL | Freq: Once | INTRAMUSCULAR | Status: AC
  Administered 2014-01-05: 0.5 mL via INTRAMUSCULAR
  Filled 2014-01-05: qty 0.5

## 2014-01-05 MED ORDER — MEASLES, MUMPS & RUBELLA VAC ~~LOC~~ INJ
0.5000 mL | INJECTION | Freq: Once | SUBCUTANEOUS | Status: AC
  Administered 2014-01-05: 0.5 mL via SUBCUTANEOUS
  Filled 2014-01-05: qty 0.5

## 2014-01-05 NOTE — Lactation Note (Signed)
This note was copied from the chart of Erika Eira Nanton. Lactation Consultation Note  Mother hand expressed prior to latching. Drops of colostrum viewed. Mother placed baby in cross cradle hold.  Demonstrated how to achieve a deeper latch. Sucks and swallows observed.  LS9. Mom encouraged to feed baby 8-12 times/24 hours and with feeding cues.  Reviewed waking techniques. Suggested parents monitor voids/stools and encourage longer feeds by massaging breasts. Mother will visit Peds on Thursday.    Patient Name: Erika Decker MOQHU'T Date: 01/05/2014 Reason for consult: Follow-up assessment   Maternal Data    Feeding Feeding Type: Breast Fed Length of feed: 15 min  LATCH Score/Interventions Latch: Grasps breast easily, tongue down, lips flanged, rhythmical sucking. Intervention(s): Breast massage  Audible Swallowing: Spontaneous and intermittent Intervention(s): Hand expression  Type of Nipple: Everted at rest and after stimulation  Comfort (Breast/Nipple): Soft / non-tender     Hold (Positioning): Assistance needed to correctly position infant at breast and maintain latch.  LATCH Score: 9  Lactation Tools Discussed/Used     Consult Status Consult Status: Complete    Hardie Pulley 01/05/2014, 12:42 PM

## 2014-01-05 NOTE — Discharge Summary (Signed)
Obstetric Discharge Summary Reason for Admission: induction of labor Prenatal Procedures: none Intrapartum Procedures: spontaneous vaginal delivery Postpartum Procedures: none Complications-Operative and Postpartum: 2nd degree perineal laceration Hemoglobin  Date Value Ref Range Status  01/04/2014 11.6* 12.0 - 15.0 g/dL Final     HCT  Date Value Ref Range Status  01/04/2014 34.8* 36.0 - 46.0 % Final    Physical Exam:  General: alert and cooperative Lochia: appropriate Uterine Fundus: firm Incision: healing well DVT Evaluation: No evidence of DVT seen on physical exam.  Discharge Diagnoses: Term Pregnancy-delivered  Discharge Information: Date: 01/05/2014 Activity: pelvic rest Diet: routine Medications: PNV and Ibuprofen Condition: stable Instructions: refer to practice specific booklet Discharge to: home  Patient was on ampicillin prior to admission. Has not taken for over 3 days. Patient to RTC for clean catch urine to send for culture in 1 week and sign Med-178 Plans Tubal ligation  Newborn Data: Live born female  Birth Weight: 7 lb 4.2 oz (3295 g) APGAR: 9, 9  Home with mother.  Soloman Mckeithan Dessa Phi 01/05/2014, 10:03 AM

## 2014-02-08 ENCOUNTER — Ambulatory Visit (INDEPENDENT_AMBULATORY_CARE_PROVIDER_SITE_OTHER): Payer: Medicaid Other | Admitting: Obstetrics & Gynecology

## 2014-02-08 ENCOUNTER — Encounter: Payer: Self-pay | Admitting: Obstetrics & Gynecology

## 2014-02-08 DIAGNOSIS — Z30011 Encounter for initial prescription of contraceptive pills: Secondary | ICD-10-CM

## 2014-02-08 MED ORDER — NORETHINDRONE 0.35 MG PO TABS: 1.0000 | ORAL_TABLET | Freq: Every day | ORAL | Status: DC

## 2014-02-08 NOTE — Patient Instructions (Signed)
Health Maintenance, Female A healthy lifestyle and preventative care can promote health and wellness.  Maintain regular health, dental, and eye exams.  Eat a healthy diet. Foods like vegetables, fruits, whole grains, low-fat dairy products, and lean protein foods contain the nutrients you need without too many calories. Decrease your intake of foods high in solid fats, added sugars, and salt. Get information about a proper diet from your caregiver, if necessary.  Regular physical exercise is one of the most important things you can do for your health. Most adults should get at least 150 minutes of moderate-intensity exercise (any activity that increases your heart rate and causes you to sweat) each week. In addition, most adults need muscle-strengthening exercises on 2 or more days a week.   Maintain a healthy weight. The body mass index (BMI) is a screening tool to identify possible weight problems. It provides an estimate of body fat based on height and weight. Your caregiver can help determine your BMI, and can help you achieve or maintain a healthy weight. For adults 20 years and older:  A BMI below 18.5 is considered underweight.  A BMI of 18.5 to 24.9 is normal.  A BMI of 25 to 29.9 is considered overweight.  A BMI of 30 and above is considered obese.  Maintain normal blood lipids and cholesterol by exercising and minimizing your intake of saturated fat. Eat a balanced diet with plenty of fruits and vegetables. Blood tests for lipids and cholesterol should begin at age 41 and be repeated every 5 years. If your lipid or cholesterol levels are high, you are over 50, or you are a high risk for heart disease, you may need your cholesterol levels checked more frequently.Ongoing high lipid and cholesterol levels should be treated with medicines if diet and exercise are not effective.  If you smoke, find out from your caregiver how to quit. If you do not use tobacco, do not start.  Lung  cancer screening is recommended for adults aged 66-80 years who are at high risk for developing lung cancer because of a history of smoking. Yearly low-dose computed tomography (CT) is recommended for people who have at least a 30-pack-year history of smoking and are a current smoker or have quit within the past 15 years. A pack year of smoking is smoking an average of 1 pack of cigarettes a day for 1 year (for example: 1 pack a day for 30 years or 2 packs a day for 15 years). Yearly screening should continue until the smoker has stopped smoking for at least 15 years. Yearly screening should also be stopped for people who develop a health problem that would prevent them from having lung cancer treatment.  If you are pregnant, do not drink alcohol. If you are breastfeeding, be very cautious about drinking alcohol. If you are not pregnant and choose to drink alcohol, do not exceed 1 drink per day. One drink is considered to be 12 ounces (355 mL) of beer, 5 ounces (148 mL) of wine, or 1.5 ounces (44 mL) of liquor.  Avoid use of street drugs. Do not share needles with anyone. Ask for help if you need support or instructions about stopping the use of drugs.  High blood pressure causes heart disease and increases the risk of stroke. Blood pressure should be checked at least every 1 to 2 years. Ongoing high blood pressure should be treated with medicines, if weight loss and exercise are not effective.  If you are 55 to 27  years old, ask your caregiver if you should take aspirin to prevent strokes.  Diabetes screening involves taking a blood sample to check your fasting blood sugar level. This should be done once every 3 years, after age 45, if you are within normal weight and without risk factors for diabetes. Testing should be considered at a younger age or be carried out more frequently if you are overweight and have at least 1 risk factor for diabetes.  Breast cancer screening is essential preventative care  for women. You should practice "breast self-awareness." This means understanding the normal appearance and feel of your breasts and may include breast self-examination. Any changes detected, no matter how small, should be reported to a caregiver. Women in their 20s and 30s should have a clinical breast exam (CBE) by a caregiver as part of a regular health exam every 1 to 3 years. After age 40, women should have a CBE every year. Starting at age 40, women should consider having a mammogram (breast X-ray) every year. Women who have a family history of breast cancer should talk to their caregiver about genetic screening. Women at a high risk of breast cancer should talk to their caregiver about having an MRI and a mammogram every year.  Breast cancer gene (BRCA)-related cancer risk assessment is recommended for women who have family members with BRCA-related cancers. BRCA-related cancers include breast, ovarian, tubal, and peritoneal cancers. Having family members with these cancers may be associated with an increased risk for harmful changes (mutations) in the breast cancer genes BRCA1 and BRCA2. Results of the assessment will determine the need for genetic counseling and BRCA1 and BRCA2 testing.  The Pap test is a screening test for cervical cancer. Women should have a Pap test starting at age 21. Between ages 21 and 29, Pap tests should be repeated every 2 years. Beginning at age 30, you should have a Pap test every 3 years as long as the past 3 Pap tests have been normal. If you had a hysterectomy for a problem that was not cancer or a condition that could lead to cancer, then you no longer need Pap tests. If you are between ages 65 and 70, and you have had normal Pap tests going back 10 years, you no longer need Pap tests. If you have had past treatment for cervical cancer or a condition that could lead to cancer, you need Pap tests and screening for cancer for at least 20 years after your treatment. If Pap  tests have been discontinued, risk factors (such as a new sexual partner) need to be reassessed to determine if screening should be resumed. Some women have medical problems that increase the chance of getting cervical cancer. In these cases, your caregiver may recommend more frequent screening and Pap tests.  The human papillomavirus (HPV) test is an additional test that may be used for cervical cancer screening. The HPV test looks for the virus that can cause the cell changes on the cervix. The cells collected during the Pap test can be tested for HPV. The HPV test could be used to screen women aged 30 years and older, and should be used in women of any age who have unclear Pap test results. After the age of 30, women should have HPV testing at the same frequency as a Pap test.  Colorectal cancer can be detected and often prevented. Most routine colorectal cancer screening begins at the age of 50 and continues through age 75. However, your caregiver may   recommend screening at an earlier age if you have risk factors for colon cancer. On a yearly basis, your caregiver may provide home test kits to check for hidden blood in the stool. Use of a small camera at the end of a tube, to directly examine the colon (sigmoidoscopy or colonoscopy), can detect the earliest forms of colorectal cancer. Talk to your caregiver about this at age 10, when routine screening begins. Direct examination of the colon should be repeated every 5 to 10 years through age 29, unless early forms of pre-cancerous polyps or small growths are found.  Hepatitis C blood testing is recommended for all people born from [birth year] through 1964 and any individual with known risks for hepatitis C.  Practice safe sex. Use condoms and avoid high-risk sexual practices to reduce the spread of sexually transmitted infections (STIs). Sexually active women aged 76 and younger should be checked for Chlamydia, which is a common sexually transmitted infection.  Older women with new or multiple partners should also be tested for Chlamydia. Testing for other STIs is recommended if you are sexually active and at increased risk.  Osteoporosis is a disease in which the bones lose minerals and strength with aging. This can result in serious bone fractures. The risk of osteoporosis can be identified using a bone density scan. Women ages 21 and over and women at risk for fractures or osteoporosis should discuss screening with their caregivers. Ask your caregiver whether you should be taking a calcium supplement or vitamin D to reduce the rate of osteoporosis.  Menopause can be associated with physical symptoms and risks. Hormone replacement therapy is available to decrease symptoms and risks. You should talk to your caregiver about whether hormone replacement therapy is right for you.  Use sunscreen. Apply sunscreen liberally and repeatedly throughout the day. You should seek shade when your shadow is shorter than you. Protect yourself by wearing long sleeves, pants, a wide-brimmed hat, and sunglasses year round, whenever you are outdoors.  Notify your caregiver of new moles or changes in moles, especially if there is a change in shape or color. Also notify your caregiver if a mole is larger than the size of a pencil eraser.  Stay current with your immunizations. Document Released: 01/29/2011 Document Revised: 11/10/2012 Document Reviewed: 06/17/2013 [facility] Patient Information 2015 [place], [place]. This information is not intended to replace advice given to you by your health care provider. Make sure you discuss any questions you have with your health care provider. Transcervical Hysteroscopic Sterilization Transcervical hysteroscopic sterilizationis a procedure performed to permanently prevent pregnancy. Transcervical means the procedure is done through the cervix, so no cut (incision) is needed. Tiny coils (microinserts) are placed in the fallopian tubes. After  the microinserts are placed, scar tissue forms in the fallopian tubes. The scar tissue will not allow an egg to reach the uterus. If an egg cannot reach the uterus, sperm cannot fertilize it.  It takes at least 3 months after the procedure to determine if the fallopian tubes are blocked. You will need to use another form of birth control for at least 3 months. After that period, you will need to have an X-ray procedure (hysterosalpingography) to confirm that the tubes are blocked.  LET [facility] CARE PROVIDER KNOW ABOUT:  Any allergies you have.  All medicines you are taking, including vitamins, herbs, eye drops, creams, and over-the-counter medicines.  Previous problems you or members of your family have had with the use of anesthetics.  Any blood disorders  you have.  Previous surgeries you have had.  Medical conditions you have. RISKS AND COMPLICATIONS Generally, this is a safe procedure. However, as with any procedure, complications can occur. Possible complications include:  A hole (perforation) in the uterus or fallopian tube.  Allergic reaction to the coils used to block the fallopian tubes.  The coil falling out (extrusion).  Infection.  Bleeding.  Chronic or acute pelvicpain.  Painful menstrual periods.  A pregnancy that grows inside a fallopian tube instead of the uterus (ectopic pregnancy).  One or both fallopian tubes not becoming fully blocked. BEFORE THE PROCEDURE  You must be very sure that you do not want to get pregnant. Talk about the procedure with your partner.  You may need to have a pregnancy test.  Ask your health care provider about changing or stopping your regular medicines.  You may need to keep track of your menstrual cycle. This procedure works best when it is done about 7 days after your period starts.  Talk to your health care provider about birth control.  If you are not using birth control, you may need to start 2-3 weeks before the  procedure. This makes the procedure easier and ensures that you are not pregnant. PROCEDURE This procedure takes about 30 minutes. You will be awake during the procedure.  You will be lying on your back with your feet in foot rests (stirrups).  A warm metal or plastic instrument (speculum) will be placed in your vagina to keep it open and to allow the health care provider to see the cervix.  You may be given a medicine to numb your cervix (local anesthetic).  A long, thin telescope with a camera (hysteroscope) will be put into your vagina, then through your cervix, and into the uterus. The hysteroscope allows the health care provider to see the openings to both fallopian tubes.  Through the hysteroscope, microinserts are put into the fallopian tube openings. They unwind once they are in place. They do not block the openings to the fallopian tubes, but over time, the microinserts will scar the tubes shut. AFTER THE PROCEDURE  You may be able to go home right away.  You may have mild cramps.  You may have some mild bleeding or discharge from your vagina for a few days.  In 3 months, you will need to have an X-ray (hysterosalpingography) done. This test is done to make sure your fallopian tubes are completely blocked. Until this is confirmed, you will need to use another form of birth control. Document Released: 04/03/2011 Document Revised: 05/06/2013 Document Reviewed: 02/12/2013 [facility] Patient Information 2015 [place], [place]. This information is not intended to replace advice given to you by your health care provider. Make sure you discuss any questions you have with your health care provider.

## 2014-02-08 NOTE — Progress Notes (Signed)
Subjective:     Erika Decker is a 28 y.o. female who presents for a postpartum visit. She is 4 weeks postpartum following a spontaneous vaginal delivery. I have fully reviewed the prenatal and intrapartum course. The delivery was at term. Outcome: spontaneous vaginal delivery. Anesthesia: IV sedation. Postpartum course has been normal. Baby's course has been unremarkable. Baby is feeding by breast. Bleeding no bleeding. Bowel function is normal. Bladder function is normal. Patient is not sexually active. Contraception method is none. Postpartum depression screening: negative.  Tobacco, alcohol and substance abuse history reviewed.  Adult immunizations reviewed including TDAP, rubella and varicella.  The following portions of the patient's history were reviewed and updated as appropriate: allergies, current medications, past family history, past medical history, past social history, past surgical history and problem list.  Review of Systems Pertinent items are noted in HPI.   Objective:    BP 121/72  Pulse 41  Temp(Src) 97.7 F (36.5 C)  Ht 5' (1.524 m)  Wt 63.957 kg (141 lb)  BMI 27.54 kg/m2  Breastfeeding? Yes  General:  alert   Breasts:  not performed  Lungs: clear to auscultation bilaterally  Heart:  regular rate and rhythm, S1, S2 normal, no murmur, click, rub or gallop  Abdomen: soft, non-tender; bowel sounds normal; no masses,  no organomegaly   Vulva:  normal  Vagina: normal vagina  Cervix:  no lesions  Corpus: normal  Adnexa:  normal adnexa  Rectal Exam: Not performed.          Assessment:     Normal postpartum exam.   Plan:    Plans Essure  Follow up as needed.

## 2014-02-10 LAB — GC/CHLAMYDIA PROBE AMP
CT Probe RNA: NEGATIVE
GC PROBE AMP APTIMA: NEGATIVE

## 2014-02-10 LAB — WET PREP BY MOLECULAR PROBE
CANDIDA SPECIES: NEGATIVE
Gardnerella vaginalis: NEGATIVE
Trichomonas vaginosis: NEGATIVE

## 2014-03-01 ENCOUNTER — Other Ambulatory Visit: Payer: Self-pay | Admitting: *Deleted

## 2014-03-05 ENCOUNTER — Encounter (HOSPITAL_COMMUNITY): Payer: Self-pay | Admitting: Pharmacist

## 2014-03-09 ENCOUNTER — Encounter (HOSPITAL_COMMUNITY): Payer: Self-pay | Admitting: *Deleted

## 2014-03-18 NOTE — H&P (Signed)
  Chief Complaint: 28 y.o. who presents for a hysteroscopic sterilization procedure  Details of Present Illness: The patient requests a hysteroscopic sterilization procedure.  LMP 02/10/2014  Breastfeeding? Yes  Past Medical History  Diagnosis Date  . Abnormal Pap smear 2011    Colpo > paps nml after  . Dizziness   . Vaginal Pap smear, abnormal   . Medical history non-contributory    History   Social History  . Marital Status: Married    Spouse Name: N/A    Number of Children: N/A  . Years of Education: N/A   Occupational History  . Not on file.   Social History Main Topics  . Smoking status: Never Smoker   . Smokeless tobacco: Never Used  . Alcohol Use: No  . Drug Use: No  . Sexual Activity: Not Currently    Birth Control/ Protection: None   Other Topics Concern  . Not on file   Social History Narrative  . No narrative on file   Family History  Problem Relation Age of Onset  . Diabetes Mother   . Hypertension Mother   . Asthma Brother   . Depression Brother   . Birth defects Brother     hole in heart    Pertinent items are noted in HPI.  Pre-Op Diagnosis: desires sterilization   Planned Procedure: Procedure(s): ESSURE TUBAL STERILIZATION  I have reviewed the patient's history and have completed the physical exam and Oakleigh R Suzie Portelaayne is acceptable for surgery.  Roseanna RainbowJACKSON-MOORE,Tyyne Cliett A, MD 03/18/2014 12:29 PM

## 2014-03-19 ENCOUNTER — Ambulatory Visit (HOSPITAL_COMMUNITY): Payer: Medicaid Other | Admitting: Anesthesiology

## 2014-03-19 ENCOUNTER — Encounter (HOSPITAL_COMMUNITY)
Admission: RE | Disposition: A | Discharge status: Home / Self Care | Payer: Self-pay | Source: Ambulatory Visit | Attending: Obstetrics & Gynecology

## 2014-03-19 ENCOUNTER — Encounter (HOSPITAL_COMMUNITY): Payer: Medicaid Other | Admitting: Anesthesiology

## 2014-03-19 ENCOUNTER — Ambulatory Visit (HOSPITAL_COMMUNITY)
Admission: RE | Admit: 2014-03-19 | Discharge: 2014-03-19 | Disposition: A | Discharge status: Home / Self Care | Payer: Medicaid Other | Source: Ambulatory Visit | Attending: Obstetrics & Gynecology | Admitting: Obstetrics & Gynecology

## 2014-03-19 ENCOUNTER — Encounter (HOSPITAL_COMMUNITY): Payer: Self-pay | Admitting: *Deleted

## 2014-03-19 DIAGNOSIS — Z641 Problems related to multiparity: Secondary | ICD-10-CM | POA: Insufficient documentation

## 2014-03-19 DIAGNOSIS — Z302 Encounter for sterilization: Secondary | ICD-10-CM | POA: Insufficient documentation

## 2014-03-19 HISTORY — PX: TUBAL LIGATION: SHX77

## 2014-03-19 HISTORY — DX: Other specified health status: Z78.9

## 2014-03-19 LAB — CBC
HEMATOCRIT: 38.9 % (ref 36.0–46.0)
HEMOGLOBIN: 13.5 g/dL (ref 12.0–15.0)
MCH: 28.8 pg (ref 26.0–34.0)
MCHC: 34.7 g/dL (ref 30.0–36.0)
MCV: 82.9 fL (ref 78.0–100.0)
Platelets: 217 10*3/uL (ref 150–400)
RBC: 4.69 MIL/uL (ref 3.87–5.11)
RDW: 12.8 % (ref 11.5–15.5)
WBC: 3.6 10*3/uL — ABNORMAL LOW (ref 4.0–10.5)

## 2014-03-19 LAB — PREGNANCY, URINE: Preg Test, Ur: NEGATIVE

## 2014-03-19 SURGERY — ESSURE TUBAL STERILIZATION
Anesthesia: General | Site: Uterus | Laterality: Bilateral

## 2014-03-19 MED ORDER — ACETAMINOPHEN 650 MG RE SUPP
650.0000 mg | RECTAL | Status: DC | PRN
  Filled 2014-03-19: qty 1

## 2014-03-19 MED ORDER — FENTANYL CITRATE 0.05 MG/ML IJ SOLN
INTRAMUSCULAR | Status: AC
  Filled 2014-03-19: qty 2

## 2014-03-19 MED ORDER — ONDANSETRON HCL 4 MG/2ML IJ SOLN
INTRAMUSCULAR | Status: AC
  Filled 2014-03-19: qty 2

## 2014-03-19 MED ORDER — ACETAMINOPHEN 325 MG PO TABS: 650.0000 mg | ORAL_TABLET | ORAL | Status: DC | PRN

## 2014-03-19 MED ORDER — KETOROLAC TROMETHAMINE 30 MG/ML IJ SOLN
INTRAMUSCULAR | Status: AC
  Filled 2014-03-19: qty 1

## 2014-03-19 MED ORDER — FENTANYL CITRATE 0.05 MG/ML IJ SOLN
INTRAMUSCULAR | Status: DC | PRN
  Administered 2014-03-19: 100 ug via INTRAVENOUS

## 2014-03-19 MED ORDER — LACTATED RINGERS IV SOLN
INTRAVENOUS | Status: DC
  Administered 2014-03-19 (×2): via INTRAVENOUS

## 2014-03-19 MED ORDER — PROPOFOL 10 MG/ML IV EMUL
INTRAVENOUS | Status: AC
  Filled 2014-03-19: qty 20

## 2014-03-19 MED ORDER — ACETAMINOPHEN 160 MG/5ML PO SOLN
960.0000 mg | Freq: Four times a day (QID) | ORAL | Status: DC | PRN
  Administered 2014-03-19: 960 mg via ORAL

## 2014-03-19 MED ORDER — SODIUM CHLORIDE 0.9 % IV SOLN: 250.0000 mL | INTRAVENOUS | Status: DC | PRN

## 2014-03-19 MED ORDER — LIDOCAINE HCL (CARDIAC) 20 MG/ML IV SOLN
INTRAVENOUS | Status: DC | PRN
  Administered 2014-03-19: 60 mg via INTRAVENOUS

## 2014-03-19 MED ORDER — MIDAZOLAM HCL 2 MG/2ML IJ SOLN
INTRAMUSCULAR | Status: AC
  Filled 2014-03-19: qty 2

## 2014-03-19 MED ORDER — OXYCODONE HCL 5 MG PO TABS
5.0000 mg | ORAL_TABLET | ORAL | Status: DC | PRN
Start: 2014-03-19 — End: 2014-03-19

## 2014-03-19 MED ORDER — LIDOCAINE HCL (CARDIAC) 20 MG/ML IV SOLN
INTRAVENOUS | Status: AC
  Filled 2014-03-19: qty 5

## 2014-03-19 MED ORDER — SODIUM CHLORIDE 0.9 % IJ SOLN: 3.0000 mL | INTRAMUSCULAR | Status: DC | PRN

## 2014-03-19 MED ORDER — SODIUM CHLORIDE 0.9 % IJ SOLN: 3.0000 mL | Freq: Two times a day (BID) | INTRAMUSCULAR | Status: DC

## 2014-03-19 MED ORDER — SCOPOLAMINE 1 MG/3DAYS TD PT72
MEDICATED_PATCH | TRANSDERMAL | Status: AC
  Filled 2014-03-19: qty 1

## 2014-03-19 MED ORDER — OXYCODONE-ACETAMINOPHEN 5-325 MG PO TABS: 2.0000 | ORAL_TABLET | Freq: Four times a day (QID) | ORAL | Status: DC | PRN

## 2014-03-19 MED ORDER — PROPOFOL 10 MG/ML IV BOLUS
INTRAVENOUS | Status: DC | PRN
  Administered 2014-03-19: 180 mg via INTRAVENOUS

## 2014-03-19 MED ORDER — SCOPOLAMINE 1 MG/3DAYS TD PT72
MEDICATED_PATCH | TRANSDERMAL | Status: AC
  Administered 2014-03-19: 1.5 mg via TRANSDERMAL
  Filled 2014-03-19: qty 1

## 2014-03-19 MED ORDER — ONDANSETRON HCL 4 MG/2ML IJ SOLN
INTRAMUSCULAR | Status: DC | PRN
  Administered 2014-03-19: 4 mg via INTRAVENOUS

## 2014-03-19 MED ORDER — SCOPOLAMINE 1 MG/3DAYS TD PT72
1.0000 | MEDICATED_PATCH | Freq: Once | TRANSDERMAL | Status: DC
  Administered 2014-03-19: 1.5 mg via TRANSDERMAL

## 2014-03-19 MED ORDER — MIDAZOLAM HCL 2 MG/2ML IJ SOLN
INTRAMUSCULAR | Status: DC | PRN
  Administered 2014-03-19: 1 mg via INTRAVENOUS

## 2014-03-19 MED ORDER — LIDOCAINE HCL 1 % IJ SOLN
INTRAMUSCULAR | Status: DC | PRN
  Administered 2014-03-19: 10 mL

## 2014-03-19 MED ORDER — FENTANYL CITRATE 0.05 MG/ML IJ SOLN: 25.0000 ug | INTRAMUSCULAR | Status: DC | PRN

## 2014-03-19 MED ORDER — ACETAMINOPHEN 160 MG/5ML PO SOLN
ORAL | Status: AC
  Administered 2014-03-19: 960 mg via ORAL
  Filled 2014-03-19: qty 40.6

## 2014-03-19 MED ORDER — KETOROLAC TROMETHAMINE 30 MG/ML IJ SOLN
INTRAMUSCULAR | Status: DC | PRN
  Administered 2014-03-19: 30 mg via INTRAVENOUS

## 2014-03-19 MED ORDER — LIDOCAINE HCL 1 % IJ SOLN
INTRAMUSCULAR | Status: AC
  Filled 2014-03-19: qty 20

## 2014-03-19 MED ORDER — SODIUM CHLORIDE 0.9 % IR SOLN
Status: DC | PRN
  Administered 2014-03-19: 3000 mL

## 2014-03-19 SURGICAL SUPPLY — 13 items
CATH ROBINSON RED A/P 16FR (CATHETERS) ×3 IMPLANT
CLOTH BEACON ORANGE TIMEOUT ST (SAFETY) ×3 IMPLANT
DECANTER SPIKE VIAL GLASS SM (MISCELLANEOUS) ×3 IMPLANT
GLOVE BIO SURGEON STRL SZ 6.5 (GLOVE) ×4 IMPLANT
GLOVE BIO SURGEONS STRL SZ 6.5 (GLOVE) ×2
GLOVE SURG SS PI 7.0 STRL IVOR (GLOVE) ×18 IMPLANT
GOWN STRL REUS W/TWL LRG LVL3 (GOWN DISPOSABLE) ×6 IMPLANT
KIT ESSURE FALLOPIAN CLOSURE (Ring) ×3 IMPLANT
PACK VAGINAL MINOR WOMEN LF (CUSTOM PROCEDURE TRAY) ×3 IMPLANT
SET TUBING HYSTEROSCOPY 2 NDL (TUBING) ×3 IMPLANT
TOWEL OR 17X24 6PK STRL BLUE (TOWEL DISPOSABLE) ×6 IMPLANT
TUBE HYSTEROSCOPY W Y-CONNECT (TUBING) ×3 IMPLANT
WATER STERILE IRR 1000ML POUR (IV SOLUTION) ×3 IMPLANT

## 2014-03-19 NOTE — Op Note (Signed)
Preoperative diagnosis: Multiparity, desires sterilization  Postoperative diagnosis: Same  Procedure: Hysteroscopy, Essure tubal occlusion Surgeon: Antionette CharJACKSON-MOORE,Jenniferann Stuckert A  Anesthesia: Laryngeal mask airway, paracervical block  Estimated blood loss: Minimal  Urine output: Per anesthesiology  IV Fluids: Per anesthesiology  Complications: None  Specimen: N/A  Operative Findings: Scarring noted in the the fundus and right cornu.  Slightly lush appearing endometrium in the fundus.  Description of procedure:   The patient was taken to the operating room and placed on the operating table in the semi-lithotomy position in [place] stirrups.  Examination under anesthesia was performed.  The patient was prepped and draped in the usual manner.  After a time-out had been completed, a speculum was placed in the vagina.  The anterior lip of the cervix was grasped with a single-toothed tenaculum.  A paracervical block was performed using 10 ml of 1% lidocaine.  The block was performed at 4 and 8 o'clock at the cervical vaginal junction.  A 5 mm 30 hysteroscope was then inserted under direct visualization using glycine as a distending medium. The uterine cavity was viewed and the above findings were noted. The Essure tubal occlusion was then inserted through the operative port and the tip of the Essure device easily slid into the right ostia. The coil was advanced and easily placed.   The device was withdrawn. There were 2 trailing coils in the uterine cavity after removal of the insertion device. The device was removed and reloaded. The device was inserted into the left ostia in a similar fashion. There were 0 trailing coils in the uterine cavity.  All the instruments were removed from the vagina.  Final instrument counts were correct.  The patient was taken to the PACU in stable condition.

## 2014-03-19 NOTE — Discharge Instructions (Signed)
Transcervical Hysteroscopic Sterilization Transcervical hysteroscopic sterilizationis a procedure performed to permanently prevent pregnancy. Transcervical means the procedure is done through the cervix, so no cut (incision) is needed. Tiny coils (microinserts) are placed in the fallopian tubes. After the microinserts are placed, scar tissue forms in the fallopian tubes. The scar tissue will not allow an egg to reach the uterus. If an egg cannot reach the uterus, sperm cannot fertilize it.  It takes at least 3 months after the procedure to determine if the fallopian tubes are blocked. You will need to use another form of birth control for at least 3 months. After that period, you will need to have an X-ray procedure (hysterosalpingography) to confirm that the tubes are blocked.  LET YOUR HEALTH CARE PROVIDER KNOW ABOUT:  Any allergies you have.  All medicines you are taking, including vitamins, herbs, eye drops, creams, and over-the-counter medicines.  Previous problems you or members of your family have had with the use of anesthetics.  Any blood disorders you have.  Previous surgeries you have had.  Medical conditions you have. RISKS AND COMPLICATIONS Generally, this is a safe procedure. However, as with any procedure, complications can occur. Possible complications include:  A hole (perforation) in the uterus or fallopian tube.  Allergic reaction to the coils used to block the fallopian tubes.  The coil falling out (extrusion).  Infection.  Bleeding.  Chronic or acute pelvicpain.  Painful menstrual periods.  A pregnancy that grows inside a fallopian tube instead of the uterus (ectopic pregnancy).  One or both fallopian tubes not becoming fully blocked. BEFORE THE PROCEDURE  You must be very sure that you do not want to get pregnant. Talk about the procedure with your partner.  You may need to have a pregnancy test.  Ask your health care provider about changing or  stopping your regular medicines.  You may need to keep track of your menstrual cycle. This procedure works best when it is done about 7 days after your period starts.  Talk to your health care provider about birth control.  If you are not using birth control, you may need to start 2-3 weeks before the procedure. This makes the procedure easier and ensures that you are not pregnant. PROCEDURE This procedure takes about 30 minutes. You will be awake during the procedure.  You will be lying on your back with your feet in foot rests (stirrups).  A warm metal or plastic instrument (speculum) will be placed in your vagina to keep it open and to allow the health care provider to see the cervix.  You may be given a medicine to numb your cervix (local anesthetic).  A long, thin telescope with a camera (hysteroscope) will be put into your vagina, then through your cervix, and into the uterus. The hysteroscope allows the health care provider to see the openings to both fallopian tubes.  Through the hysteroscope, microinserts are put into the fallopian tube openings. They unwind once they are in place. They do not block the openings to the fallopian tubes, but over time, the microinserts will scar the tubes shut. AFTER THE PROCEDURE  You may be able to go home right away.  You may have mild cramps.  You may have some mild bleeding or discharge from your vagina for a few days.  In 3 months, you will need to have an X-ray (hysterosalpingography) done. This test is done to make sure your fallopian tubes are completely blocked. Until this is confirmed, you will need   to use another form of birth control. Document Released: 04/03/2011 Document Revised: 05/06/2013 Document Reviewed: 02/12/2013 Exit[employer] Patient Information 2015 [employer]. This information is not intended to replace advice given to you by your health care provider. Make sure you discuss any questions you have with your health care  provider.  

## 2014-03-19 NOTE — Transfer of Care (Signed)
Immediate Anesthesia Transfer of Care Note  Patient: Erika Decker  Procedure(s) Performed: Procedure(s): ESSURE TUBAL STERILIZATION (Bilateral)  Patient Location: PACU  Anesthesia Type:General  Level of Consciousness: awake, alert  and oriented  Airway & Oxygen Therapy: Patient Spontanous Breathing and Patient connected to nasal cannula oxygen  Post-op Assessment: Report given to PACU RN and Post -op Vital signs reviewed and stable  Post vital signs: Reviewed and stable  Complications: No apparent anesthesia complications

## 2014-03-19 NOTE — Anesthesia Preprocedure Evaluation (Signed)

## 2014-03-19 NOTE — Interval H&P Note (Signed)
History and Physical Interval Note:  03/19/2014 11:37 AM  Lysbeth GalasShanet R Gadd  has presented today for surgery, with the diagnosis of desires sterilization  The various methods of treatment have been discussed with the patient and family. After consideration of risks, benefits and other options for treatment, the patient has consented to  Procedure(s): ESSURE TUBAL STERILIZATION (Bilateral) as a surgical intervention .  The patient's history has been reviewed, patient examined, no change in status, stable for surgery.  I have reviewed the patient's chart and labs.  Questions were answered to the patient's satisfaction.     JACKSON-MOORE,Gloria Ricardo A

## 2014-03-19 NOTE — Anesthesia Postprocedure Evaluation (Signed)
  Anesthesia Post-op Note  Patient: Erika Decker  Procedure(s) Performed: Procedure(s): ESSURE TUBAL STERILIZATION (Bilateral) Patient is awake and responsive. Pain and nausea are reasonably well controlled. Vital signs are stable and clinically acceptable. Oxygen saturation is clinically acceptable. There are no apparent anesthetic complications at this time. Patient is ready for discharge.

## 2014-03-22 ENCOUNTER — Encounter (HOSPITAL_COMMUNITY): Payer: Self-pay | Admitting: Obstetrics & Gynecology

## 2014-03-22 DIAGNOSIS — Z302 Encounter for sterilization: Secondary | ICD-10-CM

## 2014-03-26 ENCOUNTER — Other Ambulatory Visit: Payer: Self-pay | Admitting: *Deleted

## 2014-03-26 ENCOUNTER — Telehealth: Payer: Self-pay | Admitting: *Deleted

## 2014-03-26 MED ORDER — IBUPROFEN 800 MG PO TABS: 800.0000 mg | ORAL_TABLET | Freq: Three times a day (TID) | ORAL | Status: DC | PRN

## 2014-03-26 NOTE — Telephone Encounter (Signed)
Patient called stating she would like a refill on her Ibuprofen for pain because the percocet she was prescribed after her procedure makes her sick. Patient states she also knows pain is expected but is she suppose to be having pain with Urination. Discussed with Dr. Clearance Coots ok to send Ibuprofen and to have the patient to come in to leave a urine specimen. Patient notified that if the painful urination is really bad that she can go to [facility] or if she can wait, we would like for her to come in Monday to leave a urine specimen so that we can send it for culture to treat her appropriately. Patient states she will come in Monday. Front notified to expect the patient Monday for a urine sample.

## 2014-03-29 ENCOUNTER — Other Ambulatory Visit (INDEPENDENT_AMBULATORY_CARE_PROVIDER_SITE_OTHER): Payer: Medicaid Other

## 2014-03-29 VITALS — BP 113/66 | HR 75 | Temp 97.4°F | Ht 60.0 in | Wt 139.0 lb

## 2014-03-29 DIAGNOSIS — Z3202 Encounter for pregnancy test, result negative: Secondary | ICD-10-CM

## 2014-03-29 DIAGNOSIS — N390 Urinary tract infection, site not specified: Secondary | ICD-10-CM

## 2014-03-29 LAB — POCT URINALYSIS DIPSTICK
BILIRUBIN UA: NEGATIVE
Blood, UA: NEGATIVE
Glucose, UA: NEGATIVE
Ketones, UA: NEGATIVE
NITRITE UA: NEGATIVE
PH UA: 7
PROTEIN UA: NEGATIVE
Spec Grav, UA: 1.01
Urobilinogen, UA: NEGATIVE

## 2014-03-29 MED ORDER — SULFAMETHOXAZOLE-TMP DS 800-160 MG PO TABS: 1.0000 | ORAL_TABLET | Freq: Two times a day (BID) | ORAL | Status: DC

## 2014-03-29 NOTE — Progress Notes (Signed)
Patient in office for a possible UTI. Patient states she is having lower back pain and a burning sensation with urination. Patient denies and frequency.  Per Nursing Protocol urine culture sent to the lab and prescription for Bactrim DS sent to the pharmacy. Patient instructed to contact the office if symptoms persist.   BP 113/66  Pulse 75  Temp(Src) 97.4 F (36.3 C)  Ht 5' (1.524 m)  Wt 139 lb (63.05 kg)  BMI 27.15 kg/m2  LMP 03/18/2014  Breastfeeding? No

## 2014-03-31 ENCOUNTER — Telehealth: Payer: Self-pay | Admitting: *Deleted

## 2014-03-31 LAB — URINE CULTURE: Colony Count: >=100000

## 2014-03-31 NOTE — Telephone Encounter (Signed)
Patient states she has quit breastfeeding and is needing to switch her birth control. Requesting prescription to be sent to the pharmacy.

## 2014-04-01 ENCOUNTER — Other Ambulatory Visit: Payer: Self-pay | Admitting: Obstetrics

## 2014-04-01 DIAGNOSIS — N390 Urinary tract infection, site not specified: Secondary | ICD-10-CM

## 2014-04-01 MED ORDER — NITROFURANTOIN MONOHYD MACRO 100 MG PO CAPS: 100.0000 mg | ORAL_CAPSULE | Freq: Two times a day (BID) | ORAL | Status: DC

## 2014-04-02 LAB — POCT URINE PREGNANCY: Preg Test, Ur: NEGATIVE

## 2014-04-02 NOTE — Addendum Note (Signed)
Addended by: Odessa Fleming on: 04/02/2014 09:42 AM   Modules accepted: Orders

## 2014-04-07 ENCOUNTER — Encounter: Payer: Medicaid Other | Admitting: Obstetrics

## 2014-04-08 NOTE — Telephone Encounter (Signed)
Please advise 

## 2014-04-09 NOTE — Telephone Encounter (Signed)
Call patient and find out what she wants to switch to.

## 2014-04-15 ENCOUNTER — Encounter: Payer: Medicaid Other | Admitting: Obstetrics

## 2014-04-15 NOTE — Telephone Encounter (Signed)
Contacted patient and she states has received a new birth control prescription.

## 2014-04-27 ENCOUNTER — Ambulatory Visit (INDEPENDENT_AMBULATORY_CARE_PROVIDER_SITE_OTHER): Payer: Medicaid Other | Admitting: Obstetrics

## 2014-04-27 ENCOUNTER — Encounter: Payer: Self-pay | Admitting: Obstetrics

## 2014-04-27 VITALS — BP 105/54 | HR 75 | Temp 97.4°F | Ht 60.0 in | Wt 140.0 lb

## 2014-04-27 DIAGNOSIS — B9689 Other specified bacterial agents as the cause of diseases classified elsewhere: Secondary | ICD-10-CM

## 2014-04-27 DIAGNOSIS — N760 Acute vaginitis: Secondary | ICD-10-CM

## 2014-04-27 DIAGNOSIS — A499 Bacterial infection, unspecified: Secondary | ICD-10-CM

## 2014-04-27 DIAGNOSIS — Z23 Encounter for immunization: Secondary | ICD-10-CM

## 2014-04-27 MED ORDER — METRONIDAZOLE 500 MG PO TABS: 500.0000 mg | ORAL_TABLET | Freq: Two times a day (BID) | ORAL | Status: DC

## 2014-04-27 NOTE — Progress Notes (Signed)
Subjective:     Erika Decker is a 28 y.o. female here for a routine exam.  S/P Essure tubal sterilization.  Current complaints: none.    Personal health questionnaire:  Is patient Ashkenazi Jewish, have a family history of breast and/or ovarian cancer: no Is there a family history of uterine cancer diagnosed at age < 4050, gastrointestinal cancer, urinary tract cancer, family member who is a Personnel officerLynch syndrome-associated carrier: no Is the patient overweight and hypertensive, family history of diabetes, personal history of gestational diabetes or PCOS: no Is patient over 4455, have PCOS,  family history of premature CHD under age 27, diabetes, smoke, have hypertension or peripheral artery disease:  no At any time, has a partner hit, kicked or otherwise hurt or frightened you?: no Over the past 2 weeks, have you felt down, depressed or hopeless?: no Over the past 2 weeks, have you felt little interest or pleasure in doing things?:no   Gynecologic History Patient's last menstrual period was 04/17/2014. Contraception: Essure Last Pap: 2014. Results were: normal Last mammogram: n/a. Results were: n/a  Obstetric History OB History  Gravida Para Term Preterm AB SAB TAB Ectopic Multiple Living  4 4 4       4     # Outcome Date GA Lbr Len/2nd Weight Sex Delivery Anes PTL Lv  4 TRM 01/03/14 1311w1d 06:00 / 00:06 7 lb 4.2 oz (3.295 kg) F SVD Local  Y  3 TRM 11/09/12 4833w4d 06:05 / 00:27 7 lb 7.6 oz (3.39 kg) M SVD None  Y  2 TRM 12/23/09 5615w3d  7 lb 7 oz (3.374 kg) F SVD None  Y     Comments: No complications  1 TRM 10/05/07 3255w0d  6 lb 4 oz (2.835 kg) M SVD EPI  Y     Comments: No complications      Past Medical History  Diagnosis Date  . Abnormal Pap smear 2011    Colpo > paps nml after  . Dizziness   . Vaginal Pap smear, abnormal   . Medical history non-contributory     Past Surgical History  Procedure Laterality Date  . Colposcopy    . Tubal ligation Bilateral 03/19/2014    Procedure:  ESSURE TUBAL STERILIZATION;  Surgeon: Antionette CharLisa Jackson-Moore, MD;  Location: [facility];  Service: Gynecology;  Laterality: Bilateral;    Current outpatient prescriptions:ibuprofen (ADVIL,MOTRIN) 800 MG tablet, Take 1 tablet (800 mg total) by mouth every 8 (eight) hours as needed., Disp: 60 tablet, Rfl: 5;  norethindrone (MICRONOR,CAMILA,ERRIN) 0.35 MG tablet, Take 1 tablet (0.35 mg total) by mouth daily., Disp: 1 Package, Rfl: 11;  Prenatal Vit-Fe Fumarate-FA (PRENATAL MULTIVITAMIN) TABS tablet, Take 1 tablet by mouth daily at 12 noon., Disp: , Rfl:  metroNIDAZOLE (FLAGYL) 500 MG tablet, Take 1 tablet (500 mg total) by mouth 2 (two) times daily., Disp: 14 tablet, Rfl: 2 No current facility-administered medications for this visit. Facility-Administered Medications Ordered in Other Visits: rho(d) immune globulin (RHIG/Rhophylac) injection 300 mcg, 300 mcg, Intramuscular, Once, Antionette CharLisa Jackson-Moore, MD No Known Allergies  History  Substance Use Topics  . Smoking status: Never Smoker   . Smokeless tobacco: Never Used  . Alcohol Use: No    Family History  Problem Relation Age of Onset  . Diabetes Mother   . Hypertension Mother   . Asthma Brother   . Depression Brother   . Birth defects Brother     hole in heart      Review of Systems  Constitutional: negative for  fatigue and weight loss Respiratory: negative for cough and wheezing Cardiovascular: negative for chest pain, fatigue and palpitations Gastrointestinal: negative for abdominal pain and change in bowel habits Musculoskeletal:negative for myalgias Neurological: negative for gait problems and tremors Behavioral/Psych: negative for abusive relationship, depression Endocrine: negative for temperature intolerance   Genitourinary:negative for abnormal menstrual periods, genital lesions, hot flashes, sexual problems and vaginal discharge Integument/breast: negative for breast lump, breast tenderness, nipple discharge and skin lesion(s)     Objective:       BP 105/54  Pulse 75  Temp(Src) 97.4 F (36.3 C)  Ht 5' (1.524 m)  Wt 140 lb (63.504 kg)  BMI 27.34 kg/m2  LMP 04/17/2014  Breastfeeding? No General:   alert  Skin:   no rash or abnormalities  Lungs:   clear to auscultation bilaterally  Heart:   regular rate and rhythm, S1, S2 normal, no murmur, click, rub or gallop  Breasts:   normal without suspicious masses, skin or nipple changes or axillary nodes  Abdomen:  normal findings: no organomegaly, soft, non-tender and no hernia  Pelvis:  External genitalia: normal general appearance Urinary system: urethral meatus normal and bladder without fullness, nontender Vaginal: normal without tenderness, induration or masses Cervix: normal appearance Adnexa: normal bimanual exam Uterus: anteverted and non-tender, normal size   Lab Review Urine pregnancy test Labs reviewed yes Radiologic studies reviewed no    Assessment:    Healthy female exam.   S/P Essure tubal sterilization.   Plan:    Education reviewed: safe sex/STD prevention. Follow up in: 3 months.  Annual and Pap.  Meds ordered this encounter  Medications  . metroNIDAZOLE (FLAGYL) 500 MG tablet    Sig: Take 1 tablet (500 mg total) by mouth 2 (two) times daily.    Dispense:  14 tablet    Refill:  2   Orders Placed This Encounter  Procedures  . WET PREP BY MOLECULAR PROBE  . Flu Vaccine QUAD 36+ mos IM (Fluarix)

## 2014-04-28 LAB — WET PREP BY MOLECULAR PROBE
Candida species: NEGATIVE
GARDNERELLA VAGINALIS: NEGATIVE
Trichomonas vaginosis: NEGATIVE

## 2014-05-31 ENCOUNTER — Encounter: Payer: Self-pay | Admitting: Obstetrics

## 2014-06-07 ENCOUNTER — Ambulatory Visit: Payer: Medicaid Other | Admitting: Obstetrics

## 2014-06-08 ENCOUNTER — Ambulatory Visit: Payer: Medicaid Other | Admitting: Obstetrics

## 2014-06-11 ENCOUNTER — Encounter: Payer: Self-pay | Admitting: Obstetrics

## 2014-06-11 ENCOUNTER — Ambulatory Visit (INDEPENDENT_AMBULATORY_CARE_PROVIDER_SITE_OTHER): Payer: Medicaid Other | Admitting: Obstetrics

## 2014-06-11 VITALS — BP 119/74 | HR 76 | Temp 97.1°F | Ht 60.0 in | Wt 140.0 lb

## 2014-06-11 DIAGNOSIS — Z01419 Encounter for gynecological examination (general) (routine) without abnormal findings: Secondary | ICD-10-CM

## 2014-06-11 DIAGNOSIS — N390 Urinary tract infection, site not specified: Secondary | ICD-10-CM | POA: Insufficient documentation

## 2014-06-11 DIAGNOSIS — Z3041 Encounter for surveillance of contraceptive pills: Secondary | ICD-10-CM

## 2014-06-11 LAB — POCT URINALYSIS DIPSTICK
Bilirubin, UA: NEGATIVE
Blood, UA: 30
GLUCOSE UA: NEGATIVE
Ketones, UA: NEGATIVE
NITRITE UA: NEGATIVE
Spec Grav, UA: 1.015
UROBILINOGEN UA: NEGATIVE
pH, UA: 6

## 2014-06-11 MED ORDER — LEVONORGESTREL-ETHINYL ESTRAD 0.15-30 MG-MCG PO TABS: 1.0000 | ORAL_TABLET | Freq: Every day | ORAL | Status: DC

## 2014-06-11 MED ORDER — NITROFURANTOIN MONOHYD MACRO 100 MG PO CAPS: 100.0000 mg | ORAL_CAPSULE | Freq: Two times a day (BID) | ORAL | Status: DC

## 2014-06-12 ENCOUNTER — Encounter: Payer: Self-pay | Admitting: Obstetrics

## 2014-06-12 LAB — WET PREP BY MOLECULAR PROBE
Candida species: NEGATIVE
GARDNERELLA VAGINALIS: NEGATIVE
TRICHOMONAS VAG: NEGATIVE

## 2014-06-12 NOTE — Progress Notes (Signed)
Subjective:     Erika Decker is a 28 y.o. female here for a routine exam.  Current complaints: Frequency and burning with urination.    Personal health questionnaire:  Is patient Ashkenazi Jewish, have a family history of breast and/or ovarian cancer: no Is there a family history of uterine cancer diagnosed at age < 10250, gastrointestinal cancer, urinary tract cancer, family member who is a Personnel officerLynch syndrome-associated carrier: no Is the patient overweight and hypertensive, family history of diabetes, personal history of gestational diabetes or PCOS: no Is patient over 8255, have PCOS,  family history of premature CHD under age 27, diabetes, smoke, have hypertension or peripheral artery disease:  no At any time, has a partner hit, kicked or otherwise hurt or frightened you?: no Over the past 2 weeks, have you felt down, depressed or hopeless?: no Over the past 2 weeks, have you felt little interest or pleasure in doing things?:no   Gynecologic History Patient's last menstrual period was 05/15/2014 (exact date). Contraception: Essure Last Pap: 2014. Results were: normal Last mammogram: n/a. Results were: n/a  Obstetric History OB History  Gravida Para Term Preterm AB SAB TAB Ectopic Multiple Living  4 4 4       4     # Outcome Date GA Lbr Len/2nd Weight Sex Delivery Anes PTL Lv  4 Term 01/03/14 6146w1d 06:00 / 00:06 7 lb 4.2 oz (3.295 kg) F Vag-Spont Local  Y  3 Term 11/09/12 6248w4d 06:05 / 00:27 7 lb 7.6 oz (3.39 kg) M Vag-Spont None  Y     Comments: none  2 Term 12/23/09 4070w3d  7 lb 7 oz (3.374 kg) F Vag-Spont None  Y     Comments: No complications  1 Term 10/05/07 3184w0d  6 lb 4 oz (2.835 kg) M Vag-Spont EPI  Y     Comments: No complications      Past Medical History  Diagnosis Date  . Abnormal Pap smear 2011    Colpo > paps nml after  . Dizziness   . Vaginal Pap smear, abnormal   . Medical history non-contributory     Past Surgical History  Procedure Laterality Date  .  Colposcopy    . Tubal ligation Bilateral 03/19/2014    Procedure: ESSURE TUBAL STERILIZATION;  Surgeon: Antionette CharLisa Jackson-Moore, MD;  Location: [facility];  Service: Gynecology;  Laterality: Bilateral;    Current outpatient prescriptions: ibuprofen (ADVIL,MOTRIN) 800 MG tablet, Take 1 tablet (800 mg total) by mouth every 8 (eight) hours as needed., Disp: 60 tablet, Rfl: 5;  Prenatal Vit-Fe Fumarate-FA (PRENATAL MULTIVITAMIN) TABS tablet, Take 1 tablet by mouth daily at 12 noon., Disp: , Rfl: ;  levonorgestrel-ethinyl estradiol (NORDETTE) 0.15-30 MG-MCG tablet, Take 1 tablet by mouth daily., Disp: 1 Package, Rfl: 11 nitrofurantoin, macrocrystal-monohydrate, (MACROBID) 100 MG capsule, Take 1 capsule (100 mg total) by mouth 2 (two) times daily., Disp: 14 capsule, Rfl: 2 No current facility-administered medications for this visit. Facility-Administered Medications Ordered in Other Visits: rho(d) immune globulin (RHIG/Rhophylac) injection 300 mcg, 300 mcg, Intramuscular, Once, Antionette CharLisa Jackson-Moore, MD No Known Allergies  History  Substance Use Topics  . Smoking status: Never Smoker   . Smokeless tobacco: Never Used  . Alcohol Use: No    Family History  Problem Relation Age of Onset  . Diabetes Mother   . Hypertension Mother   . Asthma Brother   . Depression Brother   . Birth defects Brother     hole in heart  Review of Systems  Constitutional: negative for fatigue and weight loss Respiratory: negative for cough and wheezing Cardiovascular: negative for chest pain, fatigue and palpitations Gastrointestinal: negative for abdominal pain and change in bowel habits Musculoskeletal:negative for myalgias Neurological: negative for gait problems and tremors Behavioral/Psych: negative for abusive relationship, depression Endocrine: negative for temperature intolerance   Genitourinary:negative for abnormal menstrual periods, genital lesions, hot flashes, sexual problems and vaginal discharge.  Positive  for burning and frequency with urination  Integument/breast: negative for breast lump, breast tenderness, nipple discharge and skin lesion(s)    Objective:       BP 119/74 mmHg  Pulse 76  Temp(Src) 97.1 F (36.2 C)  Ht 5' (1.524 m)  Wt 140 lb (63.504 kg)  BMI 27.34 kg/m2  LMP 05/15/2014 (Exact Date)  Breastfeeding? No General:   alert  Skin:   no rash or abnormalities  Lungs:   clear to auscultation bilaterally  Heart:   regular rate and rhythm, S1, S2 normal, no murmur, click, rub or gallop  Breasts:   normal without suspicious masses, skin or nipple changes or axillary nodes  Abdomen:  normal findings: no organomegaly, soft, non-tender and no hernia  Pelvis:  External genitalia: normal general appearance Urinary system: urethral meatus normal and bladder without fullness, nontender Vaginal: normal without tenderness, induration or masses Cervix: normal appearance Adnexa: normal bimanual exam Uterus: anteverted and non-tender, normal size   Lab Review Urine pregnancy test Labs reviewed yes Radiologic studies reviewed yes    Assessment:    Healthy female exam.    Plan:    Education reviewed: low fat, low cholesterol diet, safe sex/STD prevention, weight bearing exercise and follow up after Essure Sterilization. Contraception: Essure. Follow up in: 1 month.  Needs Hysterosalpingogram done for follow up after Essure.  Meds ordered this encounter  Medications  . nitrofurantoin, macrocrystal-monohydrate, (MACROBID) 100 MG capsule    Sig: Take 1 capsule (100 mg total) by mouth 2 (two) times daily.    Dispense:  14 capsule    Refill:  2  . levonorgestrel-ethinyl estradiol (NORDETTE) 0.15-30 MG-MCG tablet    Sig: Take 1 tablet by mouth daily.    Dispense:  1 Package    Refill:  11   Orders Placed This Encounter  Procedures  . WET PREP BY MOLECULAR PROBE  . Urine culture  . POCT urinalysis dipstick

## 2014-06-13 LAB — URINE CULTURE: Colony Count: >=100000

## 2014-06-14 ENCOUNTER — Other Ambulatory Visit: Payer: Self-pay | Admitting: Obstetrics

## 2014-06-14 LAB — PAP IG W/ RFLX HPV ASCU

## 2014-06-17 ENCOUNTER — Telehealth: Payer: Self-pay

## 2014-06-17 ENCOUNTER — Other Ambulatory Visit: Payer: Self-pay | Admitting: *Deleted

## 2014-06-17 DIAGNOSIS — Z308 Encounter for other contraceptive management: Secondary | ICD-10-CM

## 2014-06-17 NOTE — Telephone Encounter (Signed)
[facility] patient has to call us when menstral cycle starts - she will call us and will schedule 7-10 days from that - discussed with patient 06/17/14

## 2014-06-17 NOTE — Progress Notes (Signed)
Order placed for hysterosalpingogram to be done for follow up essure.  Pt has follow up appt with Dr Tamela OddiJackson Moore afterward.

## 2014-07-07 ENCOUNTER — Institutional Professional Consult (permissible substitution): Payer: Medicaid Other | Admitting: Obstetrics & Gynecology

## 2014-07-19 ENCOUNTER — Ambulatory Visit (INDEPENDENT_AMBULATORY_CARE_PROVIDER_SITE_OTHER): Payer: Medicaid Other | Admitting: Obstetrics & Gynecology

## 2014-07-19 ENCOUNTER — Encounter: Payer: Self-pay | Admitting: Obstetrics & Gynecology

## 2014-07-19 ENCOUNTER — Telehealth: Payer: Self-pay

## 2014-07-19 VITALS — BP 124/80 | HR 88 | Temp 98.0°F | Ht 60.0 in | Wt 140.0 lb

## 2014-07-19 DIAGNOSIS — Z308 Encounter for other contraceptive management: Secondary | ICD-10-CM

## 2014-07-19 NOTE — Telephone Encounter (Signed)
let patient know u/s time and date

## 2014-07-19 NOTE — Progress Notes (Signed)
Patient ID: Erika GalasShanet R Babe, female   DOB: 1986/03/03, 28 y.o.   MRN: [id number]  Chief Complaint  Patient presents with  . Follow-up    Essure     HPI Erika Decker is a 28 y.o. female.  Missed 6 week postop appointment/HSG to document tubal occlusion.  HPI  Past Medical History  Diagnosis Date  . Abnormal Pap smear 2011    Colpo > paps nml after  . Dizziness   . Vaginal Pap smear, abnormal   . Medical history non-contributory     Past Surgical History  Procedure Laterality Date  . Colposcopy    . Tubal ligation Bilateral 03/19/2014    Procedure: ESSURE TUBAL STERILIZATION;  Surgeon: Antionette CharLisa Jackson-Moore, MD;  Location: [facility];  Service: Gynecology;  Laterality: Bilateral;    Family History  Problem Relation Age of Onset  . Diabetes Mother   . Hypertension Mother   . Asthma Brother   . Depression Brother   . Birth defects Brother     hole in heart    Social History History  Substance Use Topics  . Smoking status: Never Smoker   . Smokeless tobacco: Never Used  . Alcohol Use: No    No Known Allergies  Current Outpatient Prescriptions  Medication Sig Dispense Refill  . levonorgestrel-ethinyl estradiol (NORDETTE) 0.15-30 MG-MCG tablet Take 1 tablet by mouth daily. 1 Package 11  . Prenatal Vit-Fe Fumarate-FA (PRENATAL MULTIVITAMIN) TABS tablet Take 1 tablet by mouth daily at 12 noon.    Marland Kitchen. ibuprofen (ADVIL,MOTRIN) 800 MG tablet Take 1 tablet (800 mg total) by mouth every 8 (eight) hours as needed. (Patient not taking: Reported on 07/19/2014) 60 tablet 5  . nitrofurantoin, macrocrystal-monohydrate, (MACROBID) 100 MG capsule Take 1 capsule (100 mg total) by mouth 2 (two) times daily. (Patient not taking: Reported on 07/19/2014) 14 capsule 2   No current facility-administered medications for this visit.   Facility-Administered Medications Ordered in Other Visits  Medication Dose Route Frequency Provider Last Rate Last Dose  . rho(d) immune globulin (RHIG/Rhophylac)  injection 300 mcg  300 mcg Intramuscular Once Antionette CharLisa Jackson-Moore, MD        Review of Systems Review of Systems Constitutional: negative for fatigue and weight loss Respiratory: negative for cough and wheezing Cardiovascular: negative for chest pain, fatigue and palpitations Gastrointestinal: negative for abdominal pain and change in bowel habits Genitourinary:negative Integument/breast: negative for nipple discharge Musculoskeletal:negative for myalgias Neurological: negative for gait problems and tremors Behavioral/Psych: negative for abusive relationship, depression Endocrine: negative for temperature intolerance     Blood pressure 124/80, pulse 88, temperature 98 F (36.7 C), height 5' (1.524 m), weight 63.504 kg (140 lb), last menstrual period 07/08/2014, not currently breastfeeding.  Physical Exam Physical Exam General:   alert  Skin:   no rash or abnormalities  Lungs:   clear to auscultation bilaterally  Heart:   regular rate and rhythm, S1, S2 normal, no murmur, click, rub or gallop  Abdomen:  normal findings: no organomegaly, soft, non-tender and no hernia  Pelvis:  External genitalia: normal general appearance Urinary system: urethral meatus normal and bladder without fullness, nontender Vaginal: normal without tenderness, induration or masses Cervix: normal appearance Adnexa: normal bimanual exam Uterus: anteverted and non-tender, normal size       Data Reviewed None  Assessment    S/P Essure     Plan    Orders Placed This Encounter  Procedures  . DG Hysterogram (HSG)    Js//[email or web address]    Post essure  Unknown about birth controll       Need urine pregnancy test day of exam Per Barb patient is on birth control called Nordax.    Standing Status: Future     Number of Occurrences:      Standing Expiration Date: 09/20/2015    Scheduling Instructions:     Needs hysterosalpingogram to follow up post essure    Order Specific Question:  Reason for Exam (SYMPTOM  OR  DIAGNOSIS REQUIRED)    Answer:  Follow Up to Essure    Order Specific Question:  Is the patient pregnant?    Answer:  No    Order Specific Question:  Preferred imaging [facility]?    [facility]    Possible management options include: continue current contraceptive method Follow up as needed.         JACKSON-MOORE,Dana Debo A 07/19/2014, 12:21 PM

## 2014-07-26 ENCOUNTER — Encounter: Payer: Self-pay | Admitting: *Deleted

## 2014-07-27 ENCOUNTER — Encounter: Payer: Self-pay | Admitting: Obstetrics & Gynecology

## 2014-08-03 ENCOUNTER — Ambulatory Visit: Payer: Medicaid Other | Admitting: Obstetrics

## 2014-08-13 ENCOUNTER — Ambulatory Visit (HOSPITAL_COMMUNITY): Payer: Medicaid Other

## 2014-08-18 ENCOUNTER — Ambulatory Visit (HOSPITAL_COMMUNITY)
Admission: RE | Admit: 2014-08-18 | Discharge: 2014-08-18 | Disposition: A | Discharge status: Home / Self Care | Payer: Medicaid Other | Source: Ambulatory Visit | Attending: Obstetrics & Gynecology | Admitting: Obstetrics & Gynecology

## 2014-08-18 DIAGNOSIS — Z3049 Encounter for surveillance of other contraceptives: Secondary | ICD-10-CM | POA: Diagnosis present

## 2014-08-18 DIAGNOSIS — Z308 Encounter for other contraceptive management: Secondary | ICD-10-CM

## 2014-08-18 MED ORDER — IOHEXOL 300 MG/ML  SOLN
20.0000 mL | Freq: Once | INTRAMUSCULAR | Status: AC | PRN
  Administered 2014-08-18: 20 mL

## 2015-02-11 ENCOUNTER — Encounter: Payer: Self-pay | Admitting: Obstetrics

## 2015-02-11 ENCOUNTER — Ambulatory Visit (INDEPENDENT_AMBULATORY_CARE_PROVIDER_SITE_OTHER): Payer: Medicaid Other | Admitting: Obstetrics

## 2015-02-11 VITALS — BP 103/65 | HR 54 | Temp 98.2°F | Ht 61.0 in | Wt 133.0 lb

## 2015-02-11 DIAGNOSIS — N760 Acute vaginitis: Secondary | ICD-10-CM

## 2015-02-11 DIAGNOSIS — A499 Bacterial infection, unspecified: Secondary | ICD-10-CM

## 2015-02-11 DIAGNOSIS — B9689 Other specified bacterial agents as the cause of diseases classified elsewhere: Secondary | ICD-10-CM

## 2015-02-11 MED ORDER — TINIDAZOLE 500 MG PO TABS: 1000.0000 mg | ORAL_TABLET | Freq: Every day | ORAL | Status: DC

## 2015-02-11 NOTE — Progress Notes (Signed)
Patient ID: Erika Decker, female   DOB: April 15, 1986, 29 y.o.   MRN: [id number]  Chief Complaint  Patient presents with  . Gynecologic Exam    HPI Erika GalasShanet R Prieto is a 29 y.o. female.  Malodorous vaginal discharge.  Denies itching, dysuria or pelvic pain.  HPI  Past Medical History  Diagnosis Date  . Abnormal Pap smear 2011    Colpo > paps nml after  . Dizziness   . Vaginal Pap smear, abnormal   . Medical history non-contributory     Past Surgical History  Procedure Laterality Date  . Colposcopy    . Tubal ligation Bilateral 03/19/2014    Procedure: ESSURE TUBAL STERILIZATION;  Surgeon: Antionette CharLisa Jackson-Moore, MD;  Location: [facility];  Service: Gynecology;  Laterality: Bilateral;    Family History  Problem Relation Age of Onset  . Diabetes Mother   . Hypertension Mother   . Asthma Brother   . Depression Brother   . Birth defects Brother     hole in heart    Social History History  Substance Use Topics  . Smoking status: Never Smoker   . Smokeless tobacco: Never Used  . Alcohol Use: No    No Known Allergies  Current Outpatient Prescriptions  Medication Sig Dispense Refill  . levonorgestrel-ethinyl estradiol (NORDETTE) 0.15-30 MG-MCG tablet Take 1 tablet by mouth daily. (Patient not taking: Reported on 02/11/2015) 1 Package 11  . tinidazole (TINDAMAX) 500 MG tablet Take 2 tablets (1,000 mg total) by mouth daily with breakfast. 10 tablet 2   No current facility-administered medications for this visit.   Facility-Administered Medications Ordered in Other Visits  Medication Dose Route Frequency Provider Last Rate Last Dose  . rho(d) immune globulin (RHIG/Rhophylac) injection 300 mcg  300 mcg Intramuscular Once Antionette CharLisa Jackson-Moore, MD        Review of Systems Review of Systems Constitutional: negative for fatigue and weight loss Respiratory: negative for cough and wheezing Cardiovascular: negative for chest pain, fatigue and palpitations Gastrointestinal: negative for  abdominal pain and change in bowel habits Genitourinary: positive for malodorous vaginal discharge Integument/breast: negative for nipple discharge Musculoskeletal:negative for myalgias Neurological: negative for gait problems and tremors Behavioral/Psych: negative for abusive relationship, depression Endocrine: negative for temperature intolerance     Blood pressure 103/65, pulse 54, temperature 98.2 F (36.8 C), height 5\' 1"  (1.549 m), weight 133 lb (60.328 kg), last menstrual period 01/30/2015, not currently breastfeeding.  Physical Exam Physical Exam           General:  Alert and no distress. Abdomen:  normal findings: no organomegaly, soft, non-tender and no hernia  Pelvis:  External genitalia: normal general appearance Urinary system: urethral meatus normal and bladder without fullness, nontender Vaginal: gray, bubbly, malodorous fishy thin discharge Cervix: normal appearance Adnexa: normal bimanual exam Uterus: anteverted and non-tender, normal size      Data Reviewed Labs  Assessment     BV     Plan  Tindamax Rx  Orders Placed This Encounter  Procedures  . SureSwab, Vaginosis/Vaginitis Plus   Meds ordered this encounter  Medications  . tinidazole (TINDAMAX) 500 MG tablet    Sig: Take 2 tablets (1,000 mg total) by mouth daily with breakfast.    Dispense:  10 tablet    Refill:  2

## 2015-02-11 NOTE — Patient Instructions (Signed)
Bacterial Vaginosis Bacterial vaginosis is a vaginal infection that occurs when the normal balance of bacteria in the vagina is disrupted. It results from an overgrowth of certain bacteria. This is the most common vaginal infection in women of childbearing age. Treatment is important to prevent complications, especially in pregnant women, as it can cause a premature delivery. CAUSES  Bacterial vaginosis is caused by an increase in harmful bacteria that are normally present in smaller amounts in the vagina. Several different kinds of bacteria can cause bacterial vaginosis. However, the reason that the condition develops is not fully understood. RISK FACTORS Certain activities or behaviors can put you at an increased risk of developing bacterial vaginosis, including:  Having a new sex partner or multiple sex partners.  Douching.  Using an intrauterine device (IUD) for contraception. Women do not get bacterial vaginosis from toilet seats, bedding, swimming pools, or contact with objects around them. SIGNS AND SYMPTOMS  Some women with bacterial vaginosis have no signs or symptoms. Common symptoms include:  Grey vaginal discharge.  A fishlike odor with discharge, especially after sexual intercourse.  Itching or burning of the vagina and vulva.  Burning or pain with urination. DIAGNOSIS  Your health care provider will take a medical history and examine the vagina for signs of bacterial vaginosis. A sample of vaginal fluid may be taken. Your health care provider will look at this sample under a microscope to check for bacteria and abnormal cells. A vaginal pH test may also be done.  TREATMENT  Bacterial vaginosis may be treated with antibiotic medicines. These may be given in the form of a pill or a vaginal cream. A second round of antibiotics may be prescribed if the condition comes back after treatment.  HOME CARE INSTRUCTIONS   Only take over-the-counter or prescription medicines as  directed by your health care provider.  If antibiotic medicine was prescribed, take it as directed. Make sure you finish it even if you start to feel better.  Do not have sex until treatment is completed.  Tell all sexual partners that you have a vaginal infection. They should see their health care provider and be treated if they have problems, such as a mild rash or itching.  Practice safe sex by using condoms and only having one sex partner. SEEK MEDICAL CARE IF:   Your symptoms are not improving after 3 days of treatment.  You have increased discharge or pain.  You have a fever. MAKE SURE YOU:   Understand these instructions.  Will watch your condition.  Will get help right away if you are not doing well or get worse. FOR MORE INFORMATION  Centers for Disease Control and Prevention, Division of STD Prevention: www.cdc.gov/std American Sexual Health Association (ASHA): www.ashastd.org  Document Released: 07/16/2005 Document Revised: 05/06/2013 Document Reviewed: 02/25/2013 ExitCare Patient Information 2015 [employer]. This information is not intended to replace advice given to you by your health care provider. Make sure you discuss any questions you have with your health care provider.  

## 2015-02-15 ENCOUNTER — Other Ambulatory Visit: Payer: Self-pay | Admitting: Obstetrics

## 2015-02-15 LAB — SURESWAB, VAGINOSIS/VAGINITIS PLUS
ATOPOBIUM VAGINAE: 7.4 Log (cells/mL)
C. PARAPSILOSIS, DNA: NOT DETECTED
C. TROPICALIS, DNA: NOT DETECTED
C. albicans, DNA: NOT DETECTED
C. glabrata, DNA: NOT DETECTED
C. trachomatis RNA, TMA: NOT DETECTED
Gardnerella vaginalis: >8 Log (cells/mL)
LACTOBACILLUS SPECIES: NOT DETECTED Log (cells/mL)
MEGASPHAERA SPECIES: 7.9 Log (cells/mL)
N. gonorrhoeae RNA, TMA: NOT DETECTED
T. vaginalis RNA, QL TMA: NOT DETECTED

## 2015-07-14 ENCOUNTER — Ambulatory Visit: Payer: Medicaid Other | Admitting: Obstetrics

## 2015-08-23 ENCOUNTER — Encounter: Payer: Self-pay | Admitting: Obstetrics

## 2015-08-23 ENCOUNTER — Ambulatory Visit (INDEPENDENT_AMBULATORY_CARE_PROVIDER_SITE_OTHER): Payer: Medicaid Other | Admitting: Obstetrics

## 2015-08-23 VITALS — BP 107/76 | HR 65 | Wt 136.0 lb

## 2015-08-23 DIAGNOSIS — Z01419 Encounter for gynecological examination (general) (routine) without abnormal findings: Secondary | ICD-10-CM

## 2015-08-23 DIAGNOSIS — Z0000 Encounter for general adult medical examination without abnormal findings: Secondary | ICD-10-CM

## 2015-08-23 DIAGNOSIS — Z3049 Encounter for surveillance of other contraceptives: Secondary | ICD-10-CM

## 2015-08-23 NOTE — Progress Notes (Signed)
Patient ID: Erika Decker, female   DOB: 1985-12-18, 30 y.o.   MRN: [id number]  Chief Complaint  Patient presents with  . Gynecologic Exam    HPI Erika Decker is a 30 y.o. female.  Routine annual exam and pap.  No complaints.  Contraception:  Essure. HPI  Past Medical History  Diagnosis Date  . Abnormal Pap smear 2011    Colpo > paps nml after  . Dizziness   . Vaginal Pap smear, abnormal   . Medical history non-contributory     Past Surgical History  Procedure Laterality Date  . Colposcopy    . Tubal ligation Bilateral 03/19/2014    Procedure: ESSURE TUBAL STERILIZATION;  Surgeon: Antionette Char, MD;  Location: [facility];  Service: Gynecology;  Laterality: Bilateral;    Family History  Problem Relation Age of Onset  . Diabetes Mother   . Hypertension Mother   . Asthma Brother   . Depression Brother   . Birth defects Brother     hole in heart    Social History Social History  Substance Use Topics  . Smoking status: Never Smoker   . Smokeless tobacco: Never Used  . Alcohol Use: No    No Known Allergies  No current outpatient prescriptions on file.   No current facility-administered medications for this visit.   Facility-Administered Medications Ordered in Other Visits  Medication Dose Route Frequency Provider Last Rate Last Dose  . rho(d) immune globulin (RHIG/Rhophylac) injection 300 mcg  300 mcg Intramuscular Once Antionette Char, MD        Review of Systems Review of Systems Constitutional: negative for fatigue and weight loss Respiratory: negative for cough and wheezing Cardiovascular: negative for chest pain, fatigue and palpitations Gastrointestinal: negative for abdominal pain and change in bowel habits Genitourinary: negative Integument/breast: negative for nipple discharge Musculoskeletal:negative for myalgias Neurological: negative for gait problems and tremors Behavioral/Psych: negative for abusive relationship, depression Endocrine:  negative for temperature intolerance     Blood pressure 107/76, pulse 65, weight 136 lb (61.689 kg), last menstrual period 08/08/2015, not currently breastfeeding.  Physical Exam Physical Exam           General: Alert and no distress          Breasts: normal findings: no masses, tenderness or nipple discharge Abdomen:  normal findings: no organomegaly, soft, non-tender and no hernia  Pelvis:  External genitalia: normal general appearance Urinary system: urethral meatus normal and bladder without fullness, nontender Vaginal: normal without tenderness, induration or masses Cervix: normal appearance Adnexa: normal bimanual exam Uterus: anteverted and non-tender, normal size     Data Reviewed Labs  Assessment    Healthy female.  Contraception management.  Pleased with Essure.  Plan    F/U 1 year.     Orders Placed This Encounter  Procedures  . SureSwab Bacterial Vaginosis/itis   No orders of the defined types were placed in this encounter.

## 2015-08-23 NOTE — Addendum Note (Signed)
Addended by: Marya Landry D on: 08/23/2015 02:11 PM   Modules accepted: Orders

## 2015-08-24 LAB — PAP IG W/ RFLX HPV ASCU

## 2015-08-26 LAB — SURESWAB BACTERIAL VAGINOSIS/ITIS
ATOPOBIUM VAGINAE: 5.8 Log (cells/mL)
C. albicans, DNA: NOT DETECTED
C. glabrata, DNA: NOT DETECTED
C. parapsilosis, DNA: NOT DETECTED
C. tropicalis, DNA: NOT DETECTED
Gardnerella vaginalis: >8 Log (cells/mL)
LACTOBACILLUS SPECIES: NOT DETECTED Log (cells/mL)
MEGASPHAERA SPECIES: >8 Log (cells/mL)
T. vaginalis RNA, QL TMA: NOT DETECTED

## 2015-08-27 ENCOUNTER — Other Ambulatory Visit: Payer: Self-pay | Admitting: Obstetrics

## 2015-08-27 DIAGNOSIS — N760 Acute vaginitis: Principal | ICD-10-CM

## 2015-08-27 DIAGNOSIS — B9689 Other specified bacterial agents as the cause of diseases classified elsewhere: Secondary | ICD-10-CM

## 2015-08-27 MED ORDER — METRONIDAZOLE 500 MG PO TABS: 500.0000 mg | ORAL_TABLET | Freq: Two times a day (BID) | ORAL | Status: DC

## 2015-09-16 ENCOUNTER — Ambulatory Visit (INDEPENDENT_AMBULATORY_CARE_PROVIDER_SITE_OTHER): Payer: Medicaid Other | Admitting: Family Medicine

## 2015-09-16 ENCOUNTER — Encounter: Payer: Self-pay | Admitting: Family Medicine

## 2015-09-16 VITALS — BP 107/50 | HR 64 | Temp 98.1°F | Ht 61.0 in | Wt 134.5 lb

## 2015-09-16 DIAGNOSIS — Z0189 Encounter for other specified special examinations: Secondary | ICD-10-CM | POA: Diagnosis not present

## 2015-09-16 DIAGNOSIS — Z0000 Encounter for general adult medical examination without abnormal findings: Secondary | ICD-10-CM

## 2015-09-16 NOTE — Progress Notes (Signed)
    Subjective:  Erika Decker is a 30 y.o. female who presents to the [facility] today to establish care.  HPI:  Healthcare Maintenance Up to date with pap tests - gets this through her OBGYN Up to date with flu shot - gets this through work Tries to eat healthy diet and exercise daily by walking.  ROS: Full 12 system review of systems was performed and was negative.   PMH:  The following were reviewed and entered/updated in epic: Past Medical History  Diagnosis Date  . Abnormal Pap smear 2011    Colpo > paps nml after  . Dizziness   . Vaginal Pap smear, abnormal   . Medical history non-contributory    Patient Active Problem List   Diagnosis Date Noted  . Lump or mass in breast 04/30/2013   Past Surgical History  Procedure Laterality Date  . Colposcopy    . Tubal ligation Bilateral 03/19/2014    Procedure: ESSURE TUBAL STERILIZATION;  Surgeon: Antionette Char, MD;  Location: [facility];  Service: Gynecology;  Laterality: Bilateral;   Family History  Problem Relation Age of Onset  . Diabetes Mother   . Hypertension Mother   . Asthma Brother   . Depression Brother   . Birth defects Brother     hole in heart  . Hypertension Father   . Stroke Mother   . Stroke Father    Medications- reviewed and updated No current outpatient prescriptions on file.   No current facility-administered medications for this visit.   Allergies-reviewed and updated No Known Allergies  Social History   Social History  . Marital Status: Married    Spouse Name: N/A  . Number of Children: N/A  . Years of Education: N/A   Social History Main Topics  . Smoking status: Never Smoker   . Smokeless tobacco: Never Used  . Alcohol Use: No  . Drug Use: No  . Sexual Activity:    Partners: Male    Birth Control/ Protection: Condom, Surgical     Comment: Tubal Ligation    Other Topics Concern  . Not on file   Social History Narrative   Objective:  Physical Exam: BP 107/50 mmHg  Pulse 64   Temp(Src) 98.1 F (36.7 C) (Oral)  Ht  (1.549 m)  Wt 134 lb 8 oz (61.009 kg)  BMI 25.43 kg/m2  LMP 09/02/2015 (Exact Date)  Gen: NAD, resting comfortably HEENT: TMs clear bilaterally. OP clear without exudates CV: RRR with no murmurs appreciated Pulm: NWOB, CTAB with no crackles, wheezes, or rhonchi GI: Normal bowel sounds present. Soft, Nontender, Nondistended. MSK: no edema, cyanosis, or clubbing noted Skin: warm, dry Neuro: grossly normal, moves all extremities Psych: Normal affect and thought content  Assessment/Plan:  Wellness Visit No acute complaints or concerns today. Up to date with pap tests and vaccinations. Discussed continuing healthy lifestyle habits today including diet and regular exercise. Follow up in 1 year for next well visit.  Katina Degree. Jimmey Ralph, MD [facility] Resident PGY-2 09/16/2015 2:26 PM

## 2015-09-16 NOTE — Patient Instructions (Signed)
We will see you again in 1 year. Continue eating healthy and exercising every day!  Take care,  Dr Jimmey Ralph

## 2015-10-06 ENCOUNTER — Ambulatory Visit (INDEPENDENT_AMBULATORY_CARE_PROVIDER_SITE_OTHER): Payer: Medicaid Other | Admitting: Family Medicine

## 2015-10-06 ENCOUNTER — Encounter: Payer: Self-pay | Admitting: Family Medicine

## 2015-10-06 VITALS — BP 120/62 | HR 85 | Temp 98.4°F | Wt 134.8 lb

## 2015-10-06 DIAGNOSIS — H8113 Benign paroxysmal vertigo, bilateral: Secondary | ICD-10-CM | POA: Diagnosis not present

## 2015-10-06 DIAGNOSIS — J069 Acute upper respiratory infection, unspecified: Secondary | ICD-10-CM

## 2015-10-06 MED ORDER — MECLIZINE HCL 32 MG PO TABS: 32.0000 mg | ORAL_TABLET | Freq: Three times a day (TID) | ORAL | Status: DC | PRN

## 2015-10-06 MED ORDER — MECLIZINE HCL 25 MG PO TABS: 25.0000 mg | ORAL_TABLET | Freq: Three times a day (TID) | ORAL | Status: DC | PRN

## 2015-10-06 NOTE — Patient Instructions (Signed)
Thanks for letting us take care of you.   You likely have symptoms related to your Upper Respiratory Infection.   These symptoms should resolve as your infection resolves.   However, I have sent in a medication that should help with your symptoms.   Take it up to three times a day as needed for your dizziness symptoms.   Thanks for letting us take care of you.   Return for follow up if your symptoms do not improve.   Sincerely, Devota Pacealeb Baylen Dea, MD Family Medicine - PGY 2

## 2015-10-06 NOTE — Addendum Note (Signed)
Addended by: Clovis PuMARTIN, TAMIKA L on: 10/06/2015 03:11 PM   Modules accepted: Orders, Medications

## 2015-10-06 NOTE — Progress Notes (Signed)
Patient ID: Erika GalasShanet R Caddell, female   DOB: May 06, 1986, 30 y.o.   MRN: [id number]   [facility] Yolande Jollyaleb G Shoua Ressler, MD Phone: [phone]  Subjective:   # Lightheadedness - not described as room spinning, but can become that bad if she moves around too much.  - She says that she has intermittently had episodes of light headedness and vertigo for years.  - This happens about twice per year.  - She says it is not associated with anything in particular.  - She did recently have a URI at the beginning of the onset of her symptoms with severe sinus pressure, congestion, drainage, and headache. She says that these symptoms are resolving and her lightheadedness is resolving as well, but she was worried because this has lasted longer than her previous episodes.  - She has undergone extensive workup for this, including cardiac workup years ago, but nothing has been revealing thus far.  - She has not been diagnosed with BPPV that she knows of.  - Has never taken any medication for dizziness.  - She has not started any new medications.  - No recent antibiotics.  - No hearing loss.  - No head trauma.    All relevant systems were reviewed and were negative unless otherwise noted in the HPI  Past Medical History Reviewed problem list.  Medications- reviewed and updated Current Outpatient Prescriptions  Medication Sig Dispense Refill  . meclizine (ANTIVERT) 32 MG tablet Take 1 tablet (32 mg total) by mouth 3 (three) times daily as needed. 30 tablet 2   No current facility-administered medications for this visit.   Chief complaint-noted No additions to family history Social history- patient is a non smoker  Objective: BP 120/62 mmHg  Pulse 85  Temp(Src) 98.4 F (36.9 C) (Oral)  Wt 134 lb 12.8 oz (61.145 kg)  LMP 10/01/2015 (Exact Date)  Breastfeeding? No Gen: NAD, alert, cooperative with exam HEENT: NCAT, EOMI, PERRL, TMs nml, sinus pressure over maxillary sinius, no  LAD.   Neck: FROM, supple CV: RRR, good S1/S2, no murmur,  Resp: CTABL, no wheezes, non-labored Abd: SNTND, BS present, no guarding or organomegaly Ext: No edema, warm, normal tone, moves UE/LE spontaneously Neuro: Alert and oriented, No gross deficits, Dix-Hallpike negative bilaterally, EOMI, no nystagmus or saccidic eye movements.  Skin: no rashes no lesions  Assessment/Plan:  # Lightheadedness - I believe may be true lightheadedness with a component of Vertigo. Sounds like she may have intermittent vertigo. Certainly could be mostly related to URI at this time. Also possibly Vestibular Neuritis, though intermittent symptoms are more consistent with BPPV.  - No debility at this point from symptoms.  - no red flags or warning signs, no need for imaging at this time as it is improving without signs of hearing loss or other concern for central lesion.  - Treat with Antivert for now.  - If no better, or if worsening, return for follow up. Consider Steroid taper for vestibular neuritis.  - Follow up with PCP in the next 1-2 months.

## 2015-10-31 ENCOUNTER — Telehealth: Payer: Self-pay | Admitting: *Deleted

## 2015-10-31 NOTE — Telephone Encounter (Signed)
Patient is request treatment for a yeast infection- she is having itching and pain with intercourse.

## 2015-11-04 ENCOUNTER — Other Ambulatory Visit: Payer: Self-pay | Admitting: Obstetrics

## 2015-11-04 DIAGNOSIS — B3731 Acute candidiasis of vulva and vagina: Secondary | ICD-10-CM

## 2015-11-04 DIAGNOSIS — B373 Candidiasis of vulva and vagina: Secondary | ICD-10-CM

## 2015-11-04 MED ORDER — FLUCONAZOLE 200 MG PO TABS: 200.0000 mg | ORAL_TABLET | ORAL | Status: DC

## 2016-08-28 ENCOUNTER — Ambulatory Visit: Payer: Self-pay | Admitting: Obstetrics

## 2016-09-27 ENCOUNTER — Ambulatory Visit (INDEPENDENT_AMBULATORY_CARE_PROVIDER_SITE_OTHER): Payer: Self-pay | Admitting: Internal Medicine

## 2016-09-27 ENCOUNTER — Encounter: Payer: Self-pay | Admitting: Internal Medicine

## 2016-09-27 DIAGNOSIS — R42 Dizziness and giddiness: Secondary | ICD-10-CM | POA: Insufficient documentation

## 2016-09-27 HISTORY — DX: Dizziness and giddiness: R42

## 2016-09-27 MED ORDER — ONDANSETRON HCL 4 MG PO TABS: 4.0000 mg | ORAL_TABLET | Freq: Three times a day (TID) | ORAL | 0 refills | Status: DC | PRN

## 2016-09-27 NOTE — Progress Notes (Signed)
   [facility] Noralee CharsAsiyah Kanan Sobek, MD Phone: [phone]  Reason For Visit: SDA for lightheadedness   31 year old female with no significant past medical system history presents for lightheadedness. States that she has been sick for a week or so with a cold and congestion. This: Congestion is improved however patient now has lightheadedness at times when she goes from laying to standing or sitting to standing. She indicates that she is drinking plenty of fluids. However has had a decreased appetite following her cold. She still has some nasal congestion however that has improved. She denies any ear fullness, ear pain, hearing loss she denies any focal neurological findings. She denies any headaches, diplopia, photophobia. No tinnitus. She does have a history of lightheadedness last year following a viral infection which resolved after about 1 week. She states that she took meclizine 1 however did not help so she did not take that any further.  Aural fullness - No Ear or mastoid pain- No Facial weakness- No Focal neurologic findings - No Headache- No Hearing loss - No Imbalance- No Nystagmus - No Phonophobia, photophobia - No Tinnitus - No  Past Medical History Reviewed problem list.  Medications- reviewed and updated No additions to family history Social history- patient is a non- smoker  Objective: BP 106/72   Pulse (!) 54   Temp 98.3 F (36.8 C) (Oral)   Ht 5\' 1"  (1.549 m)   Wt 137 lb 12.8 oz (62.5 kg)   LMP 09/15/2016   SpO2 98%   BMI 26.04 kg/m  Gen: NAD, alert, cooperative with exam HEENT: Slight fullness along the left maxillary sinus     Neck: No masses palpated.Spotty lymphadenopathy     Ears: Tympanic membranes intact, normal light reflex, no erythema, no bulging    Eyes: HINTS exam negative - no nystagmus noted, PERRLA, EOMIA    Nose: nasal turbinates moist    Throat: moist mucus membranes, no erythema MSK: Normal gait and station Neuro:  Cn  2-7 intact Strength equal & normal in upper & lower extremities Able to walk on heels and toes.   Balance normal , finger to nose  Negative Dix-Hallpike maneuver   Assessment/Plan: See problem based a/p  Lightheadedness Post viral lightheadedness, 2-3 episodes, denies any vertigo with negative Dix-Hallpike maneuver, indicates positional with sitting to standing or lying to standing however orthostatics are within normal range. Negative neurologic exam, negative HINTS exam, therefore unlikely a central pathology of lightheadedness. Previously provided with meclizine without much benefit in the past. Likely some level of disequilibrium due to sinuses, believe that this will resolve on its own. Patient does indicate an episode of nausea associated with lightheadedness x 1.  - Will provide Zofran  - Expect improvement over the next two weeks  - Follow up in two weeks if no improvement

## 2016-09-27 NOTE — Patient Instructions (Addendum)
You likely have some level of disequilibrium from your cold. You should improve within the week. I will provide you with medication for nausea. If you have no improvement in next couple of weeks, please follow up with us.

## 2016-09-27 NOTE — Assessment & Plan Note (Signed)
Post viral lightheadedness, 2-3 episodes, denies any vertigo with negative Dix-Hallpike maneuver, indicates positional with sitting to standing or lying to standing however orthostatics are within normal range. Negative neurologic exam, negative HINTS exam, therefore unlikely a central pathology of lightheadedness. Previously provided with meclizine without much benefit in the past. Likely some level of disequilibrium due to sinuses, believe that this will resolve on its own. Patient does indicate an episode of nausea associated with lightheadedness x 1.  - Will provide Zofran  - Expect improvement over the next two weeks  - Follow up in two weeks if no improvement

## 2016-11-15 ENCOUNTER — Other Ambulatory Visit (HOSPITAL_COMMUNITY)
Admission: RE | Admit: 2016-11-15 | Discharge: 2016-11-15 | Disposition: A | Discharge status: Home / Self Care | Payer: Medicaid Other | Source: Ambulatory Visit | Attending: Obstetrics | Admitting: Obstetrics

## 2016-11-15 ENCOUNTER — Ambulatory Visit (INDEPENDENT_AMBULATORY_CARE_PROVIDER_SITE_OTHER): Payer: Medicaid Other | Admitting: Obstetrics

## 2016-11-15 ENCOUNTER — Encounter: Payer: Self-pay | Admitting: Obstetrics

## 2016-11-15 VITALS — BP 106/65 | HR 85 | Ht 61.0 in | Wt 137.0 lb

## 2016-11-15 DIAGNOSIS — Z124 Encounter for screening for malignant neoplasm of cervix: Secondary | ICD-10-CM

## 2016-11-15 DIAGNOSIS — Z308 Encounter for other contraceptive management: Secondary | ICD-10-CM

## 2016-11-15 DIAGNOSIS — N898 Other specified noninflammatory disorders of vagina: Secondary | ICD-10-CM | POA: Insufficient documentation

## 2016-11-15 DIAGNOSIS — Z0000 Encounter for general adult medical examination without abnormal findings: Secondary | ICD-10-CM | POA: Diagnosis not present

## 2016-11-15 DIAGNOSIS — Z01419 Encounter for gynecological examination (general) (routine) without abnormal findings: Secondary | ICD-10-CM

## 2016-11-15 DIAGNOSIS — Z3049 Encounter for surveillance of other contraceptives: Secondary | ICD-10-CM

## 2016-11-15 NOTE — Progress Notes (Signed)
Subjective:        Erika Decker is a 31 y.o. female here for a routine exam.  Current complaints: None.    Personal health questionnaire:  Is patient Ashkenazi Jewish, have a family history of breast and/or ovarian cancer: no Is there a family history of uterine cancer diagnosed at age < 12, gastrointestinal cancer, urinary tract cancer, family member who is a Personnel officer syndrome-associated carrier: no Is the patient overweight and hypertensive, family history of diabetes, personal history of gestational diabetes, preeclampsia or PCOS: no Is patient over 40, have PCOS,  family history of premature CHD under age 50, diabetes, smoke, have hypertension or peripheral artery disease:  no At any time, has a partner hit, kicked or otherwise hurt or frightened you?: no Over the past 2 weeks, have you felt down, depressed or hopeless?: no Over the past 2 weeks, have you felt little interest or pleasure in doing things?:no   Gynecologic History Patient's last menstrual period was 10/22/2016. Contraception: Essure Last Pap: 2017. Results were: normal Last mammogram: n/a. Results were: n/a  Obstetric History OB History  Gravida Para Term Preterm AB Living  SAB TAB Ectopic Multiple Live Births          4    # Outcome Date GA Lbr Len/2nd Weight Sex Delivery Anes PTL Lv  4 Term 01/03/14 [redacted]w[redacted]d 06:00 / 00:06 7 lb 4.2 oz (3.295 kg) F Vag-Spont Local  LIV  3 Term 11/09/12 [redacted]w[redacted]d 06:05 / 00:27 7 lb 7.6 oz (3.39 kg) M Vag-Spont None  LIV     Birth Comments: none  2 Term 12/23/09 [redacted]w[redacted]d  7 lb 7 oz (3.374 kg) F Vag-Spont None  LIV     Birth Comments: No complications  1 Term 10/05/07 [redacted]w[redacted]d  6 lb 4 oz (2.835 kg) M Vag-Spont EPI  LIV     Birth Comments: No complications      Past Medical History:  Diagnosis Date  . Abnormal Pap smear 2011   Colpo > paps nml after  . Dizziness   . Medical history non-contributory   . Vaginal Pap smear, abnormal     Past Surgical History:   Procedure Laterality Date  . COLPOSCOPY    . TUBAL LIGATION Bilateral 03/19/2014   Procedure: ESSURE TUBAL STERILIZATION;  Surgeon: Antionette Char, MD;  Location: [facility];  Service: Gynecology;  Laterality: Bilateral;     Current Outpatient Prescriptions:  .  fluconazole (DIFLUCAN) 200 MG tablet, Take 1 tablet (200 mg total) by mouth every other day., Disp: 3 tablet, Rfl: 2 .  ondansetron (ZOFRAN) 4 MG tablet, Take 1 tablet (4 mg total) by mouth every 8 (eight) hours as needed for nausea or vomiting., Disp: 20 tablet, Rfl: 0 No Known Allergies  Social History  Substance Use Topics  . Smoking status: Never Smoker  . Smokeless tobacco: Never Used  . Alcohol use No    Family History  Problem Relation Age of Onset  . Diabetes Mother   . Hypertension Mother   . Stroke Mother   . Asthma Brother   . Depression Brother   . Birth defects Brother     hole in heart  . Hypertension Father   . Stroke Father       Review of Systems  Constitutional: negative for fatigue and weight loss Respiratory: negative for cough and wheezing Cardiovascular: negative for chest pain, fatigue and palpitations Gastrointestinal: negative for abdominal pain and change  in bowel habits Musculoskeletal:negative for myalgias Neurological: negative for gait problems and tremors Behavioral/Psych: negative for abusive relationship, depression Endocrine: negative for temperature intolerance    Genitourinary:negative for abnormal menstrual periods, genital lesions, hot flashes, sexual problems and vaginal discharge Integument/breast: negative for breast lump, breast tenderness, nipple discharge and skin lesion(s)    Objective:       BP 106/65   Pulse 85   Ht  (1.549 m)   Wt 137 lb (62.1 kg)   LMP 10/22/2016   BMI 25.89 kg/m  General:   alert  Skin:   no rash or abnormalities  Lungs:   clear to auscultation bilaterally  Heart:   regular rate and rhythm, S1, S2 normal, no murmur, click, rub or  gallop  Breasts:   normal without suspicious masses, skin or nipple changes or axillary nodes  Abdomen:  normal findings: no organomegaly, soft, non-tender and no hernia  Pelvis:  External genitalia: normal general appearance Urinary system: urethral meatus normal and bladder without fullness, nontender Vaginal: normal without tenderness, induration or masses Cervix: normal appearance Adnexa: normal bimanual exam Uterus: anteverted and non-tender, normal size   Lab Review Urine pregnancy test Labs reviewed yes Radiologic studies reviewed no  50% of 20 min visit spent on counseling and coordination of care.    Assessment:    Healthy female exam.    Contraceptive Surveillance.  Pleased with Essure.   Plan:    Education reviewed: calcium supplements, depression evaluation, low fat, low cholesterol diet, safe sex/STD prevention, self breast exams and weight bearing exercise. Follow up in: 1 year.   No orders of the defined types were placed in this encounter.  No orders of the defined types were placed in this encounter.    Patient ID: Erika Decker, female   DOB: [birth year], 31 y.o.   MRN: [id number]

## 2016-11-16 ENCOUNTER — Other Ambulatory Visit: Payer: Self-pay | Admitting: Obstetrics

## 2016-11-16 DIAGNOSIS — B9689 Other specified bacterial agents as the cause of diseases classified elsewhere: Secondary | ICD-10-CM

## 2016-11-16 DIAGNOSIS — N760 Acute vaginitis: Principal | ICD-10-CM

## 2016-11-16 LAB — CERVICOVAGINAL ANCILLARY ONLY
BACTERIAL VAGINITIS: POSITIVE — AB
Candida vaginitis: NEGATIVE

## 2016-11-16 MED ORDER — METRONIDAZOLE 500 MG PO TABS
500.0000 mg | ORAL_TABLET | Freq: Two times a day (BID) | ORAL | 2 refills | Status: DC
Start: 2016-11-16 — End: 2017-03-14

## 2016-11-19 LAB — CYTOLOGY - PAP: Diagnosis: NEGATIVE

## 2017-01-23 ENCOUNTER — Telehealth: Payer: Self-pay

## 2017-01-23 NOTE — Telephone Encounter (Signed)
Pt called requesting Flagyl rf. She has rf's left per [facility]. Pt changed pharmacies. She is now using [facility]. [facility]

## 2017-01-24 ENCOUNTER — Telehealth: Payer: Self-pay | Admitting: *Deleted

## 2017-01-24 NOTE — Telephone Encounter (Signed)
Pt called office regarding refill request for Flagyl. Per previous note [facility] was to transfer refills. Call placed to pharmacy, refills were transferred but not filled for pt to pick up. Pharmacy was asked to fill Rx for pt. Pt has been made aware Rx should be available for pick up today.

## 2017-01-28 ENCOUNTER — Telehealth: Payer: Self-pay | Admitting: *Deleted

## 2017-01-28 NOTE — Telephone Encounter (Signed)
Fax from pharmacy regarding Rx for Flagyl.  Review pt chart. Pt currently listed as MCD-FP. Call placed to pt.  LM making her aware of change in ins coverage. Advised to call office if she has any other questions.

## 2017-02-14 ENCOUNTER — Telehealth: Payer: Self-pay | Admitting: *Deleted

## 2017-02-14 DIAGNOSIS — B373 Candidiasis of vulva and vagina: Secondary | ICD-10-CM

## 2017-02-14 DIAGNOSIS — B3731 Acute candidiasis of vulva and vagina: Secondary | ICD-10-CM

## 2017-02-14 MED ORDER — FLUCONAZOLE 150 MG PO TABS: 150.0000 mg | ORAL_TABLET | Freq: Once | ORAL | 0 refills | Status: AC

## 2017-02-14 NOTE — Telephone Encounter (Signed)
Patient has had a yeast infection since Monday- she is using AZO yeast plus and wants to know if that will be sufficient. Told patient it may help her symptoms- but it will not cure the yeast- she may need to use a Monistat or Mycelex treatment for that if she wants to use OTC. Patient request Diflucan Rx. Will send in per protocol. Patient to call back if she does not get better.

## 2017-03-14 ENCOUNTER — Other Ambulatory Visit: Payer: Self-pay | Admitting: Obstetrics

## 2017-03-14 ENCOUNTER — Other Ambulatory Visit (INDEPENDENT_AMBULATORY_CARE_PROVIDER_SITE_OTHER): Payer: Self-pay | Admitting: *Deleted

## 2017-03-14 VITALS — BP 111/69 | HR 87

## 2017-03-14 DIAGNOSIS — R399 Unspecified symptoms and signs involving the genitourinary system: Secondary | ICD-10-CM

## 2017-03-14 LAB — POCT URINALYSIS DIPSTICK
Bilirubin, UA: NEGATIVE
Blood, UA: 250
GLUCOSE UA: NEGATIVE
Ketones, UA: NEGATIVE
NITRITE UA: NEGATIVE
Spec Grav, UA: 1.01 (ref 1.010–1.025)
UROBILINOGEN UA: NEGATIVE U/dL — AB
pH, UA: 6 (ref 5.0–8.0)

## 2017-03-14 MED ORDER — NITROFURANTOIN MONOHYD MACRO 100 MG PO CAPS: 100.0000 mg | ORAL_CAPSULE | Freq: Two times a day (BID) | ORAL | 0 refills | Status: DC

## 2017-03-14 NOTE — Progress Notes (Signed)
SUBJECTIVE: Erika Decker is a 31 y.o. female who complains of urinary frequency, urgency and dysuria x 3 days, without flank pain, fever, chills, or abnormal vaginal discharge or bleeding.   OBJECTIVE: Appears well, in no apparent distress.  Vital signs are normal. Urine dipstick shows positive for RBC's, positive for protein and positive for leukocytes.    ASSESSMENT: Dysuria  PLAN: Treatment per orders.  Call or return to clinic prn if these symptoms worsen or fail to improve as anticipated. Patient voices understanding.

## 2017-04-04 ENCOUNTER — Ambulatory Visit (INDEPENDENT_AMBULATORY_CARE_PROVIDER_SITE_OTHER): Payer: Self-pay | Admitting: *Deleted

## 2017-04-04 DIAGNOSIS — Z202 Contact with and (suspected) exposure to infections with a predominantly sexual mode of transmission: Secondary | ICD-10-CM

## 2017-04-04 MED ORDER — METRONIDAZOLE 500 MG PO TABS: 1000.0000 mg | ORAL_TABLET | Freq: Once | ORAL | 0 refills | Status: AC

## 2017-04-04 MED ORDER — AZITHROMYCIN 500 MG PO TABS
1000.0000 mg | ORAL_TABLET | Freq: Once | ORAL | Status: AC
  Administered 2017-04-04: 1000 mg via ORAL

## 2017-04-04 MED ORDER — METRONIDAZOLE 500 MG PO TABS
1000.0000 mg | ORAL_TABLET | Freq: Once | ORAL | Status: AC
  Administered 2017-04-04: 1000 mg via ORAL

## 2017-04-04 MED ORDER — CEFTRIAXONE SODIUM 250 MG IJ SOLR
250.0000 mg | Freq: Once | INTRAMUSCULAR | Status: AC
  Administered 2017-04-04: 250 mg via INTRAMUSCULAR

## 2017-04-04 NOTE — Progress Notes (Signed)
Patient in nurse clinic today for STD treatment of gonorrhea and trichomonas treatment.  Patient advised to abstain from sex for 7-10 days after treatment or when partner has been tested/treated.  Azithromycin 1 GM PO x 1 given, Metronidazole 1 gram x 1 given and Ceftriaxone 250 mg IM x 1 given in RUOQ per Dr. Enriqueta ShutterMayo's orders.  Patient to follow up in 2-3 months for re-screening.  Advised to use condoms with all sexual activity.  Sent script to pharmacy for additional 1 gram of metronidazole that we did not have in stock. Patient verbalized understanding and husband was also treated at this same time.  Feliz BeamHARTSELL,  JAZMIN, CMA

## 2017-05-02 ENCOUNTER — Encounter (HOSPITAL_COMMUNITY): Payer: Self-pay | Admitting: Emergency Medicine

## 2017-05-02 ENCOUNTER — Ambulatory Visit (HOSPITAL_COMMUNITY)
Admission: EM | Admit: 2017-05-02 | Discharge: 2017-05-02 | Disposition: A | Discharge status: Home / Self Care | Payer: Medicaid Other | Attending: Family Medicine | Admitting: Family Medicine

## 2017-05-02 ENCOUNTER — Telehealth: Payer: Self-pay

## 2017-05-02 DIAGNOSIS — N390 Urinary tract infection, site not specified: Secondary | ICD-10-CM

## 2017-05-02 DIAGNOSIS — R399 Unspecified symptoms and signs involving the genitourinary system: Secondary | ICD-10-CM

## 2017-05-02 DIAGNOSIS — Z3202 Encounter for pregnancy test, result negative: Secondary | ICD-10-CM

## 2017-05-02 DIAGNOSIS — N898 Other specified noninflammatory disorders of vagina: Secondary | ICD-10-CM

## 2017-05-02 LAB — POCT URINALYSIS DIP (DEVICE)
BILIRUBIN URINE: NEGATIVE
Glucose, UA: NEGATIVE mg/dL
Ketones, ur: 15 mg/dL — AB
NITRITE: NEGATIVE
PH: 6 (ref 5.0–8.0)
PROTEIN: NEGATIVE mg/dL
Specific Gravity, Urine: 1.02 (ref 1.005–1.030)
UROBILINOGEN UA: 0.2 mg/dL (ref 0.0–1.0)

## 2017-05-02 LAB — POCT PREGNANCY, URINE: PREG TEST UR: NEGATIVE

## 2017-05-02 MED ORDER — NITROFURANTOIN MONOHYD MACRO 100 MG PO CAPS: 100.0000 mg | ORAL_CAPSULE | Freq: Two times a day (BID) | ORAL | 0 refills | Status: DC

## 2017-05-02 NOTE — Discharge Instructions (Signed)
You have a urinary tract infection. Will treat you with antibiotics. Take all of them. FU here as needed. Drink lots of water.

## 2017-05-02 NOTE — ED Provider Notes (Signed)
[facility]    CSN: [id number] Arrival date & time: 05/02/17  1850     History   Chief Complaint Chief Complaint  Patient presents with  . Vaginal Discharge    HPI Erika Decker is a 31 y.o. female.   31 yo presents with dysuria and not vaginal discharge as noted in the triage note. Onset 2 days. No fever or chills. No abdominal pain. She was noted to be treated for gonorrhea 2 weeks ago.       Past Medical History:  Diagnosis Date  . Abnormal Pap smear 2011   Colpo > paps nml after  . Dizziness   . Medical history non-contributory   . Vaginal Pap smear, abnormal     Patient Active Problem List   Diagnosis Date Noted  . Lightheadedness 09/27/2016    Past Surgical History:  Procedure Laterality Date  . COLPOSCOPY    . TUBAL LIGATION Bilateral 03/19/2014   Procedure: ESSURE TUBAL STERILIZATION;  Surgeon: Antionette Char, MD;  Location: [facility];  Service: Gynecology;  Laterality: Bilateral;    OB History    Gravida Para Term Preterm AB Living   SAB TAB Ectopic Multiple Live Births           4       Home Medications    Prior to Admission medications   Medication Sig Start Date End Date Taking? Authorizing Provider  nitrofurantoin, macrocrystal-monohydrate, (MACROBID) 100 MG capsule Take 1 capsule (100 mg total) by mouth 2 (two) times daily. 05/02/17   Riki Sheer, PA-C    Family History Family History  Problem Relation Age of Onset  . Diabetes Mother   . Hypertension Mother   . Stroke Mother   . Asthma Brother   . Depression Brother   . Birth defects Brother        hole in heart  . Hypertension Father   . Stroke Father     Social History Social History  Substance Use Topics  . Smoking status: Never Smoker  . Smokeless tobacco: Never Used  . Alcohol use No     Allergies   Patient has no known allergies.   Review of Systems Review of Systems  Constitutional: Negative for fever.  Gastrointestinal:  Negative for abdominal pain.  Genitourinary: Positive for dysuria and urgency. Negative for pelvic pain and vaginal discharge.  Musculoskeletal: Negative for back pain.     Physical Exam Triage Vital Signs ED Triage Vitals  Enc Vitals Group     BP 05/02/17 1916 (!) 106/57     Pulse Rate 05/02/17 1916 65     Resp 05/02/17 1916 18     Temp 05/02/17 1916 98.5 F (36.9 C)     Temp Source 05/02/17 1916 Oral     SpO2 05/02/17 1916 99 %     Weight 05/02/17 1917 131 lb (59.4 kg)     Height 05/02/17 1917  (1.549 m)     Head Circumference --      Peak Flow --      Pain Score 05/02/17 1917 0     Pain Loc --      Pain Edu? --      Excl. in GC? --    No data found.   Updated Vital Signs BP (!) 106/57 (BP Location: Right Arm)   Pulse 65   Temp 98.5 F (36.9 C) (Oral)   Resp 18  Ht  (1.549 m)   Wt 131 lb (59.4 kg)   SpO2 99%   BMI 24.75 kg/m   Visual Acuity Right Eye Distance:   Left Eye Distance:   Bilateral Distance:    Right Eye Near:   Left Eye Near:    Bilateral Near:     Physical Exam   UC Treatments / Results  Labs (all labs ordered are listed, but only abnormal results are displayed) Labs Reviewed  POCT URINALYSIS DIP (DEVICE) - Abnormal; Notable for the following:       Result Value   Ketones, ur 15 (*)    Hgb urine dipstick TRACE (*)    Leukocytes, UA LARGE (*)    All other components within normal limits  POCT PREGNANCY, URINE    EKG  EKG Interpretation None       Radiology No results found.  Procedures Procedures (including critical care time)  Medications Ordered in UC Medications - No data to display   Initial Impression / Assessment and Plan / UC Course  I have reviewed the triage vital signs and the nursing notes.  Pertinent labs & imaging results that were available during my care of the patient were reviewed by me and considered in my medical decision making (see chart for details).    U/A c/w UTI. Treat with  Macrobid. FU as needed.  Final Clinical Impressions(s) / UC Diagnoses   Final diagnoses:  Lower urinary tract infectious disease    New Prescriptions Current Discharge Medication List       Controlled Substance Prescriptions [place] Controlled Substance Registry consulted? Not Applicable   Sharin Mons 05/02/17 2004

## 2017-05-02 NOTE — ED Triage Notes (Signed)
Pt here for vaginal discharge and irritation 

## 2017-05-02 NOTE — Telephone Encounter (Signed)
Returned call, no answer, left vm 

## 2017-05-10 ENCOUNTER — Ambulatory Visit: Payer: Self-pay | Admitting: Obstetrics

## 2018-02-06 ENCOUNTER — Ambulatory Visit (INDEPENDENT_AMBULATORY_CARE_PROVIDER_SITE_OTHER): Payer: PRIVATE HEALTH INSURANCE | Admitting: Obstetrics

## 2018-02-06 ENCOUNTER — Encounter: Payer: Self-pay | Admitting: Obstetrics

## 2018-02-06 ENCOUNTER — Other Ambulatory Visit (HOSPITAL_COMMUNITY)
Admission: RE | Admit: 2018-02-06 | Discharge: 2018-02-06 | Disposition: A | Discharge status: Home / Self Care | Payer: PRIVATE HEALTH INSURANCE | Source: Ambulatory Visit | Attending: Obstetrics | Admitting: Obstetrics

## 2018-02-06 VITALS — BP 99/61 | HR 80 | Wt 136.3 lb

## 2018-02-06 DIAGNOSIS — Z3049 Encounter for surveillance of other contraceptives: Secondary | ICD-10-CM

## 2018-02-06 DIAGNOSIS — Z01419 Encounter for gynecological examination (general) (routine) without abnormal findings: Secondary | ICD-10-CM | POA: Diagnosis not present

## 2018-02-06 DIAGNOSIS — R300 Dysuria: Secondary | ICD-10-CM

## 2018-02-06 LAB — POCT URINALYSIS DIPSTICK
Bilirubin, UA: NEGATIVE
Glucose, UA: NEGATIVE
Ketones, UA: NEGATIVE
LEUKOCYTES UA: NEGATIVE
NITRITE UA: NEGATIVE
PROTEIN UA: NEGATIVE
RBC UA: NEGATIVE
Spec Grav, UA: 1.01 (ref 1.010–1.025)
Urobilinogen, UA: 0.2 E.U./dL
pH, UA: 6 (ref 5.0–8.0)

## 2018-02-06 NOTE — Progress Notes (Signed)
Subjective:        Erika Decker is a 32 y.o. female here for a routine exam.  Current complaints: Mild pain with urination.    Personal health questionnaire:  Is patient Ashkenazi Jewish, have a family history of breast and/or ovarian cancer: no Is there a family history of uterine cancer diagnosed at age < 3250, gastrointestinal cancer, urinary tract cancer, family member who is a Personnel officerLynch syndrome-associated carrier: no Is the patient overweight and hypertensive, family history of diabetes, personal history of gestational diabetes, preeclampsia or PCOS: no Is patient over 955, have PCOS,  family history of premature CHD under age 31, diabetes, smoke, have hypertension or peripheral artery disease:  no At any time, has a partner hit, kicked or otherwise hurt or frightened you?: no Over the past 2 weeks, have you felt down, depressed or hopeless?: no Over the past 2 weeks, have you felt little interest or pleasure in doing things?:no   Gynecologic History No LMP recorded. Contraception: Essure Last Pap: 2018. Results were: normal Last mammogram: n/a. Results were: n/a  Obstetric History OB History  Gravida Para Term Preterm AB Living  4 4 4     4   SAB TAB Ectopic Multiple Live Births          4    # Outcome Date GA Lbr Len/2nd Weight Sex Delivery Anes PTL Lv  4 Term 01/03/14 7766w1d 06:00 / 00:06 7 lb 4.2 oz (3.295 kg) F Vag-Spont Local  LIV  3 Term 11/09/12 3522w4d 06:05 / 00:27 7 lb 7.6 oz (3.39 kg) M Vag-Spont None  LIV     Birth Comments: none  2 Term 12/23/09 4647w3d  7 lb 7 oz (3.374 kg) F Vag-Spont None  LIV     Birth Comments: No complications  1 Term 10/05/07 7437w0d  6 lb 4 oz (2.835 kg) M Vag-Spont EPI  LIV     Birth Comments: No complications    Past Medical History:  Diagnosis Date  . Abnormal Pap smear 2011   Colpo > paps nml after  . Dizziness   . Medical history non-contributory   . Vaginal Pap smear, abnormal     Past Surgical History:  Procedure Laterality  Date  . COLPOSCOPY    . TUBAL LIGATION Bilateral 03/19/2014   Procedure: ESSURE TUBAL STERILIZATION;  Surgeon: Antionette CharLisa Jackson-Moore, MD;  Location: [facility];  Service: Gynecology;  Laterality: Bilateral;    No current outpatient medications on file. No Known Allergies  Social History   Tobacco Use  . Smoking status: Never Smoker  . Smokeless tobacco: Never Used  Substance Use Topics  . Alcohol use: No    Alcohol/week: 0.0 oz    Family History  Problem Relation Age of Onset  . Diabetes Mother   . Hypertension Mother   . Stroke Mother   . Lung cancer Mother        SMOKER   . Asthma Brother   . Depression Brother   . Birth defects Brother        hole in heart  . Hypertension Father   . Stroke Father       Review of Systems  Constitutional: negative for fatigue and weight loss Respiratory: negative for cough and wheezing Cardiovascular: negative for chest pain, fatigue and palpitations Gastrointestinal: negative for abdominal pain and change in bowel habits Musculoskeletal:negative for myalgias Neurological: negative for gait problems and tremors Behavioral/Psych: negative for abusive relationship, depression Endocrine: negative for temperature intolerance  Genitourinary:negative for abnormal menstrual periods, genital lesions, hot flashes, sexual problems and vaginal discharge.  POSITIVE for pain with urination Integument/breast: negative for breast lump, breast tenderness, nipple discharge and skin lesion(s)    Objective:       There were no vitals taken for this visit. General:   alert  Skin:   no rash or abnormalities  Lungs:   clear to auscultation bilaterally  Heart:   regular rate and rhythm, S1, S2 normal, no murmur, click, rub or gallop  Breasts:   normal without suspicious masses, skin or nipple changes or axillary nodes  Abdomen:  normal findings: no organomegaly, soft, non-tender and no hernia  Pelvis:  External genitalia: normal general  appearance Urinary system: urethral meatus normal and bladder without fullness, nontender Vaginal: normal without tenderness, induration or masses Cervix: normal appearance Adnexa: normal bimanual exam Uterus: anteverted and non-tender, normal size   Lab Review Urine pregnancy test Labs reviewed yes Radiologic studies reviewed no  50% of 20 min visit spent on counseling and coordination of care.   Assessment:     1. Encounter for routine gynecological examination with Papanicolaou smear of cervix Rx: - Cytology - PAP  2. Dysuria Rx: - POCT Urinalysis Dipstick - Urine Culture  3. Encounter for surveillance of other contraceptive - doing well after Essure Sterilization   Plan:    Education reviewed: calcium supplements, depression evaluation, low fat, low cholesterol diet, safe sex/STD prevention, self breast exams and weight bearing exercise. Follow up in: 1 year.   No orders of the defined types were placed in this encounter.  Orders Placed This Encounter  Procedures  . POCT Urinalysis Dipstick    [name] MD 02-06-2018

## 2018-02-06 NOTE — Progress Notes (Signed)
Pt presents for annual. Pt c/o dysuria and foul urine odor.

## 2018-02-07 LAB — CYTOLOGY - PAP
DIAGNOSIS: NEGATIVE
HPV (WINDOPATH): NOT DETECTED

## 2018-02-08 ENCOUNTER — Other Ambulatory Visit: Payer: Self-pay | Admitting: Obstetrics

## 2018-02-08 DIAGNOSIS — N3000 Acute cystitis without hematuria: Secondary | ICD-10-CM

## 2018-02-08 LAB — URINE CULTURE

## 2018-02-08 MED ORDER — SULFAMETHOXAZOLE-TRIMETHOPRIM 800-160 MG PO TABS: 1.0000 | ORAL_TABLET | Freq: Two times a day (BID) | ORAL | 2 refills | Status: DC

## 2018-03-11 ENCOUNTER — Telehealth: Payer: Self-pay

## 2018-03-11 NOTE — Telephone Encounter (Signed)
Return call to patient regarding UTI sx's. Pt made aware she has refills on last UTI Rx. Pt wants to try medication and if no relief will call and schedule an appt for an eval.

## 2018-04-08 NOTE — Progress Notes (Deleted)
   Subjective:  CC -- Annual Physical; With complaints of ***  Pt reports she ***   Cardiovascular: - Risk as of ***(date): *** (assessment every 3-5 years) - Dx Hypertension: no  - Dx Hyperlipidemia: no (once age 31-21; high risk >35yo, low risk >45)*** - Dx Obesity: {YES/NO/WILD CARDS:18581} (Class I BMI <34.9, Class II <39.9, Class III < 49.9)*** - Physical Activity: {YES/NO/WILD BWGYK:59935}  - Diabetes: no (age 73-70 w/ BMI >25, or HTN, or HLD) *** (A1c >6.5, or classic Sxs w/ random CBG >200, or FBG >126 (fasting >8hr))***  Cancer: Colorectal >> Colonoscopy: N/A (Risk factors?, W/o risk factors > 50yo)*** Lung >> Tobacco Use: {YES/NO/WILD TSVXB:93903} (age 70-74 w/ >25yr/pack/yr Hx, unless quit >8yr ago) ***  - If so, previous Low-Dose CT screen: {YES/NO/WILD ESPQZ:30076}  Breast >> Mammogram: N/A (>40yo to be discussed; Q19yrs)*** Cervical/Endometrial >>  - Postmenopausal: {YES/NO/WILD AUQJF:35456}  - Vaginal Bleeding: {YES/NO/WILD YBWLS:93734} - Pap Smear: yes, negative 01/2018   - Previous Abnormal Pap: yes, 2011 (21-29yo Q78yrs; >30yo Q11yr w/ neg HPV)0 Skin >> Suspicious lesions: {YES/NO/WILD KAJGO:11572}   Social: Alcohol Use: {YES/NO/WILD IOMBT:59741}  Tobacco Use: {YES/NO/WILD ULAGT:36468}   - Interested in Quitting: {YES/NO/WILD EHOZY:24825}  Other Drugs: {YES/NO/WILD OIBBC:48889}  Risky Sexual Behavior: {YES/NO/WILD CARDS:18581}  - Chlamydia: <25yo, or >25yo and incr risk - Gonorrhea: sexually active <25yo, or at increased risk - Syphilis: If at increased risk - HIV: All individuals (15-18yo once, then annually if risks are high) >> should be checked anytime STDs are checked. - Hep C: once if born in [place] between 1945-1965; or at increased risk - Hep B: If at increased risk*** Depression: {YES/NO/WILD CARDS:18581}   - PHQ9 score:  Support and Life at Home: {YES/NO/WILD VQXIH:03888}   Other: Osteoporosis: {YES/NO/WILD CARDS:18581} (postmenopausal women <65yo with  risk factors -- do BMD screen)*** Zoster Vaccine: {YES/NO/WILD KCMKL:49179} (those >50yo)*** Flu Vaccine: {YES/NO/WILD XTAVW:97948}  Pneumonia Vaccine: {YES/NO/WILD AXKPV:37482} (those w/ risk factors) - Both: Immunocompromised, cochlear implant, CSF leak, asplenic, sickle cell, CKD - PPSV-23 only: Heart dz, lung disease, DM, tobacco abuse, alcoholism, cirrhosis/liver disease.***   ROS-  Past Medical History Patient Active Problem List   Diagnosis Date Noted  . Lightheadedness 09/27/2016    Medications- reviewed and updated Current Outpatient Medications  Medication Sig Dispense Refill  . sulfamethoxazole-trimethoprim (BACTRIM DS,SEPTRA DS) 800-160 MG tablet Take 1 tablet by mouth 2 (two) times daily. 14 tablet 2   No current facility-administered medications for this visit.     Objective: There were no vitals taken for this visit. Gen: NAD, alert, cooperative with exam*** HEENT: NCAT, EOMI, PERRL CV: RRR, good S1/S2, no murmur Resp: CTABL, no wheezes, non-labored Abd: Soft, Non Tender, Non Distended, BS present, no guarding or organomegaly Genital Exam: {genitourinary exam:311642::"not done"} Ext: No edema, warm Neuro: Alert and oriented, No gross deficits   Assessment/Plan:  No problem-specific Assessment & Plan notes found for this encounter.   No orders of the defined types were placed in this encounter.   No orders of the defined types were placed in this encounter.    Erika Manis, DO, PGY-2 04/08/2018 7:47 PM

## 2018-04-10 ENCOUNTER — Encounter: Payer: PRIVATE HEALTH INSURANCE | Admitting: Family Medicine

## 2018-05-25 NOTE — Progress Notes (Signed)
   Subjective:  CC -- Annual Physical; With complaints of left ear pain Pt reports she left ear pain that began worsening last night.  States that the pulsing in her ear.  States that it is worse when chewing or talking.  Almost like an echo in her ear.  States she also notes a ringing in her ear.  Patient has a chronic balance issue so no new changes.  Also notes a muffled hearing noise at that side.  Does report some headaches but these occurred before the earache.  Headaches are usually unilateral on the right side.  Denies any nausea, vomiting, abdominal pain.  Notes photo and phonophobia during headache time.  Patient does use Q-tips regularly.  No other fluid in ear.   Cardiovascular: - Dx Hypertension: no  - Dx Hyperlipidemia: no   - Dx Obesity: no  - Physical Activity: yes, walks daily going to get gym membership  - Diabetes: no    Cancer: Colorectal >> Colonoscopy: N/A - d/t age. Grandfather with colon cancer at 76 years old.  Lung >> Tobacco Use: no   Breast >> Mammogram: N/A - d/t age   Cervical/Endometrial >>  - Postmenopausal: no  - Vaginal Bleeding: no - Pap Smear: yes, UTD. 01/2018 was negative   - Previous Abnormal Pap: yes   Skin >> Suspicious lesions: no   Social: Alcohol Use: yes. Drinks one daiquiri on birthday  Tobacco Use: no  Other Drugs: no  Risky Sexual Behavior: no  Depression: no   - PHQ9 score: 0 + not difficult at all  Support and Life at Home: yes   Other: Osteoporosis: no   Flu Vaccine: got flu vaccine @ [facility] on [address]     Past Medical History Patient Active Problem List   Diagnosis Date Noted  . Otitis, externa, infective 05/26/2018  . Lightheadedness 09/27/2016    Medications- reviewed and updated Current Outpatient Medications  Medication Sig Dispense Refill  . ciprofloxacin-dexamethasone (CIPRODEX) OTIC suspension Place 4 drops into the left ear 2 (two) times daily for 7 days. 7.5 mL 0   No current facility-administered  medications for this visit.     Objective: BP 122/80   Pulse 79   Temp 98.4 F (36.9 C)   Wt 139 lb 3.2 oz (63.1 kg)   SpO2 99%   BMI 26.30 kg/m  Gen: NAD, alert, cooperative with exam HEENT: NCAT, EOMI, PERRL, pain with insertion of otoscope  CV: RRR, good S1/S2, no murmur Resp: CTABL, no wheezes, non-labored Abd: Soft, Non Tender, Non Distended, BS present, no guarding or organomegaly Genital Exam: not done, patient with recent GYN visit for pap smear  Ext: No edema, warm Neuro: Alert and oriented, No gross deficits   Assessment/Plan:  Otitis, externa, infective Patient with likely otitis externa.  Patient having left earache, muffled hearing since yesterday.  Pain with insertion of otoscope speculum.  Likely there is inflammation.  Advised patient to stop using Q-tips.  Prescribe Ciprodex to be used x1 week.  Advised patient to follow-up in 2 weeks if no improvement.   No orders of the defined types were placed in this encounter.   Meds ordered this encounter  Medications  . ciprofloxacin-dexamethasone (CIPRODEX) OTIC suspension    Sig: Place 4 drops into the left ear 2 (two) times daily for 7 days.    Dispense:  7.5 mL    Refill:  0     Erika Manis, DO, PGY-2 05/26/2018 3:45 PM

## 2018-05-26 ENCOUNTER — Encounter: Payer: Self-pay | Admitting: Family Medicine

## 2018-05-26 ENCOUNTER — Ambulatory Visit (INDEPENDENT_AMBULATORY_CARE_PROVIDER_SITE_OTHER): Payer: PRIVATE HEALTH INSURANCE | Admitting: Family Medicine

## 2018-05-26 ENCOUNTER — Telehealth: Payer: Self-pay | Admitting: *Deleted

## 2018-05-26 DIAGNOSIS — H60392 Other infective otitis externa, left ear: Secondary | ICD-10-CM

## 2018-05-26 DIAGNOSIS — H60399 Other infective otitis externa, unspecified ear: Secondary | ICD-10-CM | POA: Insufficient documentation

## 2018-05-26 MED ORDER — CIPROFLOXACIN-DEXAMETHASONE 0.3-0.1 % OT SUSP: 4.0000 [drp] | Freq: Two times a day (BID) | OTIC | 0 refills | Status: AC

## 2018-05-26 NOTE — Assessment & Plan Note (Signed)
Patient with likely otitis externa.  Patient having left earache, muffled hearing since yesterday.  Pain with insertion of otoscope speculum.  Likely there is inflammation.  Advised patient to stop using Q-tips.  Prescribe Ciprodex to be used x1 week.  Advised patient to follow-up in 2 weeks if no improvement.

## 2018-05-26 NOTE — Telephone Encounter (Signed)
Pt called, the medication is not covered by insurance, can something else be called in? Heinrich Fertig, Maryjo Rochester, CMA

## 2018-05-26 NOTE — Patient Instructions (Signed)
Otitis Externa Otitis externa is an infection of the outer ear canal. The outer ear canal is the area between the outside of the ear and the eardrum. Otitis externa is sometimes called "swimmer's ear." Follow these instructions at home:  If you were given antibiotic ear drops, use them as told by your doctor. Do not stop using them even if your condition gets better.  Take over-the-counter and prescription medicines only as told by your doctor.  Keep all follow-up visits as told by your doctor. This is important. How is this prevented?  Keep your ear dry. Use the corner of a towel to dry your ear after you swim or bathe.  Try not to scratch or put things in your ear. Doing these things makes it easier for germs to grow in your ear.  Avoid swimming in lakes, dirty water, or pools that may not have the right amount of a chemical called chlorine.  Consider making ear drops and putting 3 or 4 drops in each ear after you swim. Ask your doctor about how you can make ear drops. Contact a doctor if:  You have a fever.  After 3 days your ear is still red, swollen, or painful.  After 3 days you still have pus coming from your ear.  Your redness, swelling, or pain gets worse.  You have a really bad headache.  You have redness, swelling, pain, or tenderness behind your ear. This information is not intended to replace advice given to you by your health care provider. Make sure you discuss any questions you have with your health care provider. Document Released: 01/02/2008 Document Revised: 08/11/2015 Document Reviewed: 04/25/2015 Elsevier Interactive Patient Education  2018 Elsevier Inc.  

## 2018-05-27 ENCOUNTER — Other Ambulatory Visit: Payer: Self-pay | Admitting: Family Medicine

## 2018-05-27 NOTE — Telephone Encounter (Signed)
Have contacted the pharmacy to see which otic solution is covered by her insurance. I am awaiting a call back.   Orpah Clinton, PGY-2 [facility] 05/27/2018 11:59 AM

## 2018-05-27 NOTE — Telephone Encounter (Signed)
Pt called to check status. Fleeger, Jessica Dawn, CMA  

## 2018-05-29 ENCOUNTER — Other Ambulatory Visit: Payer: Self-pay | Admitting: Family Medicine

## 2018-05-29 NOTE — Telephone Encounter (Signed)
Called patient and informed her that she needed to update her insurance information at her pharmacy.  Glennie Hawk, CMA

## 2018-05-29 NOTE — Telephone Encounter (Signed)
Have re-contacted pharmacy. Per pharmacist patient does not have insurance on file. If she has Nurse, learning disability or medicaid she will need to bring in an updated insurance card to the pharmacy so they can process this. If they still reject the RX please have her tell the pharmacy to contact me with the preferred drug.   Orpah Clinton, PGY-2 [facility] 05/29/2018 3:57 PM

## 2018-05-29 NOTE — Telephone Encounter (Signed)
Patient has called again to inquire about this. Have you heard back?  Call back is [phone]  Ples Specter, RN [facility] RN)

## 2018-06-02 NOTE — Telephone Encounter (Signed)
Pt called back. Per pharmacy nothing is covered by her insurance, only discounted. I tried to call pharmacy and was on hold for an unreasonable amount of time. Not sure if you know of a cheaper medication that can be sent in?

## 2018-06-02 NOTE — Telephone Encounter (Signed)
Pt called again in regards to medication. Pt patient she updated her insurance info and the medication price did not changed. Informed her to call her pharmacy to see what ear drops will be covered by her insurance. Pt to call back with information.

## 2018-06-03 NOTE — Telephone Encounter (Signed)
Spoke to pharmacy. Per pharmacy patient has NO insurance on file. Discussed with pharmacist and cheapest option would be ciprofloxacin otic drops. I have called this RX in. [facility]   [name], DO, PGY-2 [facility] 06/03/2018 11:31 AM

## 2018-06-03 NOTE — Telephone Encounter (Signed)
Called patient and informed her of the price ($44)  [facility] Darin Engels .  Patient says that she still can not afford that.  I advised her to check out Goodrx.com and see if she could find it cheaper.  Patient states she will do some research and check to see if she can find it cheaper.  Glennie Hawk, CMA

## 2018-06-11 ENCOUNTER — Other Ambulatory Visit: Payer: Self-pay | Admitting: Family Medicine

## 2018-06-11 ENCOUNTER — Telehealth: Payer: Self-pay

## 2018-06-11 MED ORDER — CIPROFLOXACIN HCL 0.2 % OT SOLN: 0.2000 mL | Freq: Two times a day (BID) | OTIC | 0 refills | Status: DC

## 2018-06-11 MED ORDER — CIPROFLOXACIN-HYDROCORTISONE 0.2-1 % OT SUSP: 3.0000 [drp] | Freq: Two times a day (BID) | OTIC | 0 refills | Status: DC

## 2018-06-11 NOTE — Telephone Encounter (Signed)
Informed patient of RX at pharmacy.  Erika Decker.Landers Prajapati R, CMA

## 2018-06-11 NOTE — Telephone Encounter (Signed)
Patient left message that she found a coupon on Good Rx for her ear medication that will make is $7 at [employer].  Please send to [facility] on [address].  Call back is [phone]  Ples SpecterAlisa Brake, RN [facility] RN)

## 2018-06-11 NOTE — Telephone Encounter (Signed)
RX sent  Oralia ManisSherin Bracken Moffa, DO, PGY-2 [facility] 06/11/2018 1:22 PM

## 2018-06-17 ENCOUNTER — Other Ambulatory Visit: Payer: Self-pay | Admitting: Family Medicine

## 2018-06-17 ENCOUNTER — Telehealth: Payer: Self-pay

## 2018-06-17 MED ORDER — NEOMYCIN-POLYMYXIN-HC 3.5-10000-1 OT SOLN: 4.0000 [drp] | Freq: Four times a day (QID) | OTIC | 0 refills | Status: DC

## 2018-06-17 NOTE — Telephone Encounter (Signed)
Pt called nurse line stating she went to pick up the ear drops with good rx coupon. Pharmacy told her the coupon was only for the oral medication, therefore leaving the ear drops to high for her. Pt is ok with taking the oral if that is something that can be called in?

## 2018-06-17 NOTE — Telephone Encounter (Signed)
Called and informed patient of RX for cortisporin.  Patient states she will call around to see if she can find it for cheaper than $35.  Glennie Hawk.Tanysha Quant R, CMA

## 2018-06-17 NOTE — Telephone Encounter (Signed)
She will not be able to take oral. [facility] On [facility] RX they are about $35 with coupon. This is the cheapest form of ear drops I have been able to find and have double checked with pharmacy staff (Dr. Raymondo BandKoval) as well.   Orpah ClintonSherin Jessica Seidman, DO, PGY-2 [facility] 06/17/2018 11:13 AM

## 2018-06-19 ENCOUNTER — Other Ambulatory Visit: Payer: Self-pay | Admitting: Family Medicine

## 2018-06-19 MED ORDER — NEOMYCIN-POLYMYXIN-HC 3.5-10000-1 OT SOLN: 4.0000 [drp] | Freq: Four times a day (QID) | OTIC | 0 refills | Status: DC

## 2018-06-19 NOTE — Addendum Note (Signed)
Addended by: Ellender HoseABRAHAM, Akshay Spang S on: 06/19/2018 10:41 AM   Modules accepted: Orders

## 2018-06-19 NOTE — Telephone Encounter (Signed)
Pt called nurse line and stated she would like this medication sent into [facility] [address]. They have the cheapest price.

## 2018-06-19 NOTE — Addendum Note (Signed)
Addended by: Steva ColderSCOTT, Lanai Conlee P on: 06/19/2018 10:38 AM   Modules accepted: Orders

## 2018-06-19 NOTE — Telephone Encounter (Signed)
[facility] [name].  Erika ClintonSherin Shannan Slinker, DO, PGY-2 [facility] 06/19/2018 10:41 AM

## 2018-06-24 ENCOUNTER — Telehealth: Payer: Self-pay | Admitting: *Deleted

## 2018-06-24 ENCOUNTER — Telehealth: Payer: Self-pay

## 2018-06-24 NOTE — Telephone Encounter (Signed)
Pt picked up the ear drops Rx to her and used them a few days.  She states that they cause her to have a headache and wants to know what to do now? Will forward to MD to advised. Kenneth Lax, Maryjo RochesterJessica Dawn, CMA

## 2018-06-24 NOTE — Telephone Encounter (Signed)
Called patient and scheduled an appointment for 06/25/2018 at 1600.  Patient states the last headache she had was on yesterday as she stopped taking the ear drops.  Patient states that after using ear drops her left ear seemed to be vibrating.  Erika Decker.Janellie Tennison R, CMA

## 2018-06-24 NOTE — Telephone Encounter (Signed)
She can stop ear drops and Please have patient follow up. Can be seen in ATC  Erika ManisSherin Nori Poland, DO, PGY-2 [facility] 06/24/2018 10:17 AM

## 2018-06-24 NOTE — Progress Notes (Signed)
  Subjective:   Patient ID: Erika Decker    DOB: [birth year], 32 y.o. female   MRN: [id number]  Erika Decker is a 32 y.o. female with no significant PMH here for   Ear pain Previously seen 05/26/2018 for ear pain.  At that time was diagnosed with otitis externa and prescribed eardrops however patient was unable to obtain these until 11/21 due to insurance issues.  Describes ear irritation as vibrating/throbbing when touching the ear or with loud noises.  She used 4 drops 4 times daily as directed however had a bad headache.  She was unsure if the eardrops caused the headache, so she decreased to 2 drops 4 times daily.  Reports her headache was persistent so on Sunday she stopped her eardrops altogether.  States her headache lasted for 3 days which is the same amount of time she was using her eardrops.  Describes headache as sharp pain located in the temporal area with radiation down her left neck.  Endorses phonophobia.  Denies nausea or vomiting or vision changes or fevers.  Tried Tylenol and warm rags with some relief.  Sleep helped to relieve the pain.  Does not have prior history of headaches, however reports that headaches whenever she was pregnant with her children.  Denies chance of pregnancy, LMP 06/15/2018.  Had sterilization procedure in 2016 after the birth of her fourth baby.  Has not had a headache since stopping her eardrops on Sunday.  Review of Systems:  Per HPI.  PMFSH, medications and smoking status reviewed.  Objective:   BP 98/60   Pulse 73   Temp 98 F (36.7 C) (Oral)   Ht 5\' 1"  (1.549 m)   Wt 141 lb 6.4 oz (64.1 kg)   SpO2 99%   BMI 26.72 kg/m  Vitals and nursing note reviewed.  General: well nourished, well developed, in no acute distress with non-toxic appearance HEENT: normocephalic, atraumatic, moist mucous membranes.  Bilateral TMs clear without bulging or purulence, canals clear of trauma or discharge.  Oropharynx clear with slight enlargement of adenoid on the  left. Neck: supple, non-tender without lymphadenopathy CV: regular rate and rhythm without murmurs, rubs, or gallops Lungs: clear to auscultation bilaterally with normal work of breathing Skin: warm, dry, no rashes or lesions Extremities: warm and well perfused, normal tone MSK: Full ROM, strength 5/5 to U/LE bilaterally, deep reflexes intact, normal gait.  No edema.  Neuro: Alert and oriented, speech normal.  Optic field normal. PERRL, Extraocular movements intact.  Intact symmetric sensation to light touch of face and extremities bilaterally.  Hearing grossly intact bilaterally.  Tongue protrudes normally with no deviation.  Shoulder shrug, smile symmetric. Finger to nose normal.  Assessment & Plan:   Eustachian tube disorder, left No infectious signs on exam.  Irritation likely due to eustachian tube dysfunction.  Will prescribe nasal steroids.  Instructed to return to clinic in a few weeks if no better.  Reports cessation of headache since stopping eardrops, instructed not to use given lack of infection.  Return precautions reviewed.  No orders of the defined types were placed in this encounter.  Meds ordered this encounter  Medications  . fluticasone (FLONASE) 50 MCG/ACT nasal spray    Sig: Place 2 sprays into both nostrils daily.    Dispense:  16 g    Refill:  0   Precepted with Dr. Jennette KettleNeal.  Ellwood DenseAlison , DO PGY-2, [facility] 06/25/2018 5:35 PM

## 2018-06-25 ENCOUNTER — Other Ambulatory Visit: Payer: Self-pay

## 2018-06-25 ENCOUNTER — Ambulatory Visit (INDEPENDENT_AMBULATORY_CARE_PROVIDER_SITE_OTHER): Payer: PRIVATE HEALTH INSURANCE | Admitting: Family Medicine

## 2018-06-25 DIAGNOSIS — H6992 Unspecified Eustachian tube disorder, left ear: Secondary | ICD-10-CM | POA: Diagnosis not present

## 2018-06-25 MED ORDER — FLUTICASONE PROPIONATE 50 MCG/ACT NA SUSP: 2.0000 | Freq: Every day | NASAL | 0 refills | Status: DC

## 2018-06-25 NOTE — Patient Instructions (Signed)
It was great to see you!  Our plans for today:  - Use Flonase daily. If you're not better in a couple weeks, you should come back to be seen.  Take care and seek immediate care sooner if you develop any concerns.   Dr. [facility]   Eustachian Tube Dysfunction The eustachian tube connects the middle ear to the back of the nose. It regulates air pressure in the middle ear by allowing air to move between the ear and nose. It also helps to drain fluid from the middle ear space. When the eustachian tube does not function properly, air pressure, fluid, or both can build up in the middle ear. Eustachian tube dysfunction can affect one or both ears. What are the causes? This condition happens when the eustachian tube becomes blocked or cannot open normally. This may result from:  Ear infections.  Colds and other upper respiratory infections.  Allergies.  Irritation, such as from cigarette smoke or acid from the stomach coming up into the esophagus (gastroesophageal reflux).  Sudden changes in air pressure, such as from descending in an airplane.  Abnormal growths in the nose or throat, such as nasal polyps, tumors, or enlarged tissue at the back of the throat (adenoids).  What increases the risk? This condition may be more likely to develop in people who smoke and people who are overweight. Eustachian tube dysfunction may also be more likely to develop in children, especially children who have:  Certain birth defects of the mouth, such as cleft palate.  Large tonsils and adenoids.  What are the signs or symptoms? Symptoms of this condition may include:  A feeling of fullness in the ear.  Ear pain.  Clicking or popping noises in the ear.  Ringing in the ear.  Hearing loss.  Loss of balance.  Symptoms may get worse when the air pressure around you changes, such as when you travel to an area of high elevation or fly on an airplane. How is this  diagnosed? This condition may be diagnosed based on:  Your symptoms.  A physical exam of your ear, nose, and throat.  Tests, such as those that measure: ? The movement of your eardrum (tympanogram). ? Your hearing (audiometry).  How is this treated? Treatment depends on the cause and severity of your condition. If your symptoms are mild, you may be able to relieve your symptoms by moving air into ("popping") your ears. If you have symptoms of fluid in your ears, treatment may include:  Decongestants.  Antihistamines.  Nasal sprays or ear drops that contain medicines that reduce swelling (steroids).  In some cases, you may need to have a procedure to drain the fluid in your eardrum (myringotomy). In this procedure, a small tube is placed in the eardrum to:  Drain the fluid.  Restore the air in the middle ear space.  Follow these instructions at home:  Take over-the-counter and prescription medicines only as told by your health care provider.  Use techniques to help pop your ears as recommended by your health care provider. These may include: ? Chewing gum. ? Yawning. ? Frequent, forceful swallowing. ? Closing your mouth, holding your nose closed, and gently blowing as if you are trying to blow air out of your nose.  Do not do any of the following until your health care provider approves: ? Travel to high altitudes. ? Fly in airplanes. ? Work in a Estate agent or room. ? Scuba dive.  Keep your ears  dry. Dry your ears completely after showering or bathing.  Do not smoke.  Keep all follow-up visits as told by your health care provider. This is important. Contact a health care provider if:  Your symptoms do not go away after treatment.  Your symptoms come back after treatment.  You are unable to pop your ears.  You have: ? A fever. ? Pain in your ear. ? Pain in your head or neck. ? Fluid draining from your ear.  Your hearing suddenly changes.  You become  very dizzy.  You lose your balance. This information is not intended to replace advice given to you by your health care provider. Make sure you discuss any questions you have with your health care provider. Document Released: 08/12/2015 Document Revised: 12/22/2015 Document Reviewed: 08/04/2014 Elsevier Interactive Patient Education  [employer]2018 Elsevier Inc.

## 2018-06-25 NOTE — Assessment & Plan Note (Signed)
No infectious signs on exam.  Irritation likely due to eustachian tube dysfunction.  Will prescribe nasal steroids.  Instructed to return to clinic in a few weeks if no better.  Reports cessation of headache since stopping eardrops, instructed not to use given lack of infection.  Return precautions reviewed.

## 2018-09-21 ENCOUNTER — Encounter (HOSPITAL_COMMUNITY): Payer: Self-pay | Admitting: Emergency Medicine

## 2018-09-21 ENCOUNTER — Emergency Department (HOSPITAL_COMMUNITY)
Admission: EM | Admit: 2018-09-21 | Discharge: 2018-09-21 | Disposition: A | Discharge status: Home / Self Care | Payer: PRIVATE HEALTH INSURANCE | Attending: Emergency Medicine | Admitting: Emergency Medicine

## 2018-09-21 ENCOUNTER — Other Ambulatory Visit: Payer: Self-pay

## 2018-09-21 DIAGNOSIS — R05 Cough: Secondary | ICD-10-CM | POA: Diagnosis present

## 2018-09-21 DIAGNOSIS — J029 Acute pharyngitis, unspecified: Secondary | ICD-10-CM | POA: Diagnosis not present

## 2018-09-21 DIAGNOSIS — R42 Dizziness and giddiness: Secondary | ICD-10-CM | POA: Diagnosis not present

## 2018-09-21 DIAGNOSIS — R0981 Nasal congestion: Secondary | ICD-10-CM | POA: Diagnosis not present

## 2018-09-21 DIAGNOSIS — J069 Acute upper respiratory infection, unspecified: Secondary | ICD-10-CM

## 2018-09-21 MED ORDER — LORATADINE-PSEUDOEPHEDRINE ER 10-240 MG PO TB24: 1.0000 | ORAL_TABLET | Freq: Every day | ORAL | 0 refills | Status: DC

## 2018-09-21 NOTE — ED Triage Notes (Signed)
Pt reports L sided sinus pain, ear pain, and sore throat since last week.  Pt denies fevers.

## 2018-09-21 NOTE — Discharge Instructions (Signed)
1.  Start taking Claritin-D once daily for the next 5 to 7 days. 2.  Schedule recheck with your family doctor. 3.  Return to the emergency department if you develop fevers that headaches confusion or other concerning symptoms.

## 2018-09-21 NOTE — ED Provider Notes (Signed)
[facility] EMERGENCY DEPARTMENT Provider Note   CSN: [id number] Arrival date & time: 09/21/18  1420    History   Chief Complaint Chief Complaint  Patient presents with  . URI  . Cough    HPI Erika Decker is a 33 y.o. female.     HPI Patient reports she had symptoms of sore throat and nasal congestion with postnasal drip that started about 2 weeks ago.  She reports that the sore throat and the drainage have improved significantly.  She reports however she has started to feel a dizziness.  She reports it feels like things are kind of spinning when she moves.  She reports is not really severe.  Is not limiting her activities.  She does not have headache, fever, visual changes.  No nausea or vomiting associated.  No incoordination associated.  Patient reports that she did have vertigo in the past.  She reports that the medication she was given however made her feel worse and too drowsy.  She reports she has been taking TheraFlu for her current symptoms. Past Medical History:  Diagnosis Date  . Abnormal Pap smear 2011   Colpo > paps nml after  . Dizziness   . Medical history non-contributory   . Vaginal Pap smear, abnormal     Patient Active Problem List   Diagnosis Date Noted  . Eustachian tube disorder, left 06/25/2018  . Otitis, externa, infective 05/26/2018  . Lightheadedness 09/27/2016    Past Surgical History:  Procedure Laterality Date  . COLPOSCOPY    . TUBAL LIGATION Bilateral 03/19/2014   Procedure: ESSURE TUBAL STERILIZATION;  Surgeon: Antionette Char, MD;  Location: [facility];  Service: Gynecology;  Laterality: Bilateral;     OB History    Gravida  4   Para  4   Term  4   Preterm      AB      Living  4     SAB      TAB      Ectopic      Multiple      Live Births  4            Home Medications    Prior to Admission medications   Medication Sig Start Date End Date Taking? Authorizing Provider  fluticasone  (FLONASE) 50 MCG/ACT nasal spray Place 2 sprays into both nostrils daily. 06/25/18   Ellwood Dense, DO  loratadine-pseudoephedrine (CLARITIN-D 24 HOUR) 10-240 MG 24 hr tablet Take 1 tablet by mouth daily. 09/21/18   Arby Barrette, MD  neomycin-polymyxin-hydrocortisone (CORTISPORIN) OTIC solution Place 4 drops into the left ear 4 (four) times daily. 06/19/18   [name], DO    Family History Family History  Problem Relation Age of Onset  . Diabetes Mother   . Hypertension Mother   . Stroke Mother   . Lung cancer Mother        SMOKER   . Asthma Brother   . Depression Brother   . Heart murmur Brother        hole in heart  . Hypertension Father   . Stroke Father     Social History Social History   Tobacco Use  . Smoking status: Never Smoker  . Smokeless tobacco: Never Used  Substance Use Topics  . Alcohol use: Yes    Alcohol/week: 0.0 standard drinks    Comment: once a year on birthday (1 daquiri)   . Drug use: No     Allergies  Patient has no known allergies.   Review of Systems Review of Systems 10 Systems reviewed and are negative for acute change except as noted in the HPI.   Physical Exam Updated Vital Signs BP 109/74 (BP Location: Right Arm)   Pulse 88   Temp 98.4 F (36.9 C) (Oral)   Resp 18   LMP 09/14/2018 (Approximate)   SpO2 100%   Physical Exam Constitutional:      Appearance: She is well-developed.  HENT:     Head: Normocephalic and atraumatic.     Ears:     Comments: Right TM has clear serous effusion with a few bubbles.  No erythema or bulging.  Left TM normal.  Turbinates are slightly boggy.  Nares are patent however with no active drainage.  No facial swelling. Eyes:     Extraocular Movements: Extraocular movements intact.     Pupils: Pupils are equal, round, and reactive to light.  Neck:     Musculoskeletal: Neck supple.  Cardiovascular:     Rate and Rhythm: Normal rate and regular rhythm.     Heart sounds: Normal heart  sounds.  Pulmonary:     Effort: Pulmonary effort is normal.     Breath sounds: Normal breath sounds.  Abdominal:     General: Bowel sounds are normal. There is no distension.     Palpations: Abdomen is soft.     Tenderness: There is no abdominal tenderness.  Musculoskeletal: Normal range of motion.  Skin:    General: Skin is warm and dry.  Neurological:     General: No focal deficit present.     Mental Status: She is alert and oriented to person, place, and time.     GCS: GCS eye subscore is 4. GCS verbal subscore is 5. GCS motor subscore is 6.     Coordination: Coordination normal.     Gait: Gait normal.  Psychiatric:        Mood and Affect: Mood normal.      ED Treatments / Results  Labs (all labs ordered are listed, but only abnormal results are displayed) Labs Reviewed - No data to display  EKG None  Radiology No results found.  Procedures Procedures (including critical care time)  Medications Ordered in ED Medications - No data to display   Initial Impression / Assessment and Plan / ED Course  I have reviewed the triage vital signs and the nursing notes.  Pertinent labs & imaging results that were available during my care of the patient were reviewed by me and considered in my medical decision making (see chart for details).       Patient with mild vertiginous symptoms associated with recent URI and sinus symptoms.  At this time not suggestive of bacterial sinusitis.  Patient is not febrile and does not have facial pain.  She feels that her symptoms are improving but she is having these dizziness now.  She reports she has had this in the past sounds like did not tolerate meclizine well.  She does not feel she really needs specific treatment for the vertigo as it is not limiting her activities.  Will suggest decongestant with Claritin-D and follow-up with PCP.  Return precautions reviewed.  Final Clinical Impressions(s) / ED Diagnoses   Final diagnoses:    Acute upper respiratory infection  Vertigo    ED Discharge Orders         Ordered    loratadine-pseudoephedrine (CLARITIN-D 24 HOUR) 10-240 MG 24 hr tablet  Daily  09/21/18 1613           Arby Barrette, MD 09/21/18 1621

## 2018-12-17 ENCOUNTER — Ambulatory Visit (INDEPENDENT_AMBULATORY_CARE_PROVIDER_SITE_OTHER): Payer: PRIVATE HEALTH INSURANCE | Admitting: Family Medicine

## 2018-12-17 ENCOUNTER — Other Ambulatory Visit: Payer: Self-pay

## 2018-12-17 ENCOUNTER — Encounter: Payer: Self-pay | Admitting: Family Medicine

## 2018-12-17 DIAGNOSIS — N6011 Diffuse cystic mastopathy of right breast: Secondary | ICD-10-CM

## 2018-12-17 NOTE — Patient Instructions (Signed)
Fibrocystic Breast Changes    Fibrocystic breast changes are changes that can make your breasts swollen or painful. These changes happen when tiny sacs of fluid (cysts) form in the breast. This is a common condition. It does not mean that you have cancer. It usually happens because of hormone changes during a monthly period.  Follow these instructions at home:   Check your breasts after every monthly period. If you do not have monthly periods, check your breasts on the first day of every month. Check for:  ? Soreness.  ? New swelling or puffiness.  ? A change in breast size.  ? A change in a lump that was already there.   Take over-the-counter and prescription medicines only as told by your doctor.   Wear a support or sports bra that fits well. Wear this support especially when you are exercising.   Avoid or have less caffeine, fat, and sugar in what you eat and drink as told by your doctor.  Contact a doctor if:   You have fluid coming from your nipple, especially if the fluid has blood in it.   You have new lumps or bumps in your breast.   Your breast gets puffy, red, and painful.   You have changes in how your breast looks.   Your nipple looks flat or it sinks into your breast.  Get help right away if:   Your breast turns red, and the redness is spreading.  Summary   Fibrocystic breast changes are changes that can make your breasts swollen or painful.   This condition can happen when you have hormone changes during your monthly period.   With this condition, it is important to check your breasts after every monthly period. If you do not have monthly periods, check your breasts on the first day of every month.  This information is not intended to replace advice given to you by your health care provider. Make sure you discuss any questions you have with your health care provider.  Document Released: 06/28/2008 Document Revised: 03/29/2016 Document Reviewed: 03/29/2016  Elsevier Interactive Patient  Education  2019 Elsevier Inc.

## 2018-12-17 NOTE — Assessment & Plan Note (Signed)
Lump likely secondary to fibrocystic changes from hormones related to menstrual cycle.  This is a normal benign finding.  Discussed this with patient.  Given family history of breast cancer have advised that patient follow-up in 4 weeks so that we can ensure area has improved and does not need further imaging such as ultrasound.  Strict return precautions given.  Advised patient that if lump does not improved when her menstrual cycle ends this week that she should come back in sooner.

## 2018-12-17 NOTE — Progress Notes (Signed)
   Subjective:    Patient ID: Erika Decker, female    DOB: 21-Oct-1985, 33 y.o.   MRN: [id number]   CC: lump on breast  HPI: Lump on breast Patient presenting for concern of lump on breast.  This is on her right side.  States that she was doing a self breast exam yesterday and felt this for the first time.  Is unsure when it started because she only noticed it during self breast exam.  Is concerned because she has a family history of breast cancer in her maternal grandmother.  Mother had a benign breast finding but has never had cancer.  No history of ovarian cancer in the family.  Has started her menstrual cycle on Monday.  Has had a breast lump in the past but this was when she was breast-feeding and it was a swollen milk duct.  Denies any nipple discharge.  Periods are irregular.  Objective:  BP 98/62   Pulse 73   SpO2 99%  Vitals and nursing note reviewed  General: well nourished, in no acute distress HEENT: normocephalic  Cardiac: Regular rate Respiratory: Speaking full sentences, no increased work of breathing Extremities: no edema or cyanosis Skin: warm and dry, no rashes noted Neuro: alert and oriented, no focal deficits Breasts: symmetric fibrous changes (somewhat larger on the right side) in both upper outer quadrants.  Assessment & Plan:    Fibrocystic change of breast, right Lump likely secondary to fibrocystic changes from hormones related to menstrual cycle.  This is a normal benign finding.  Discussed this with patient.  Given family history of breast cancer have advised that patient follow-up in 4 weeks so that we can ensure area has improved and does not need further imaging such as ultrasound.  Strict return precautions given.  Advised patient that if lump does not improved when her menstrual cycle ends this week that she should come back in sooner.  Discussed this with Dr. Jennette Kettle  Return in about 4 weeks (around 01/14/2019).   [name], DO, PGY-2

## 2019-01-02 NOTE — Progress Notes (Deleted)
   Subjective:    Patient ID: Erika Decker, female    DOB: 08/20/1985, 33 y.o.   MRN: [id number]   CC:  HPI: Lump on breast Recently seen on 5/20, at that time breast changes felt to be likely secondary to fibrocystic changes from hormones related to menstrual cycle.  Advised to follow up, at which time need for [place] would be re-evaluated.     Smoking status reviewed  Review of Systems   Objective:  There were no vitals taken for this visit. Vitals and nursing note reviewed  General: well nourished, in no acute distress HEENT: normocephalic, TM's visualized bilaterally, no scleral icterus or conjunctival pallor, no nasal discharge, moist mucous membranes, good dentition without erythema or discharge noted in posterior oropharynx Neck: supple, non-tender, without lymphadenopathy Cardiac: RRR, clear S1 and S2, no murmurs, rubs, or gallops Respiratory: clear to auscultation bilaterally, no increased work of breathing Abdomen: soft, nontender, nondistended, no masses or organomegaly. Bowel sounds present Extremities: no edema or cyanosis. Warm, well perfused. 2+ radial and PT pulses bilaterally Skin: warm and dry, no rashes noted Neuro: alert and oriented, no focal deficits   Assessment & Plan:    No problem-specific Assessment & Plan notes found for this encounter.    No follow-ups on file.   [name], DO, PGY-2

## 2019-01-05 ENCOUNTER — Ambulatory Visit: Payer: PRIVATE HEALTH INSURANCE | Admitting: Family Medicine

## 2019-01-08 ENCOUNTER — Other Ambulatory Visit: Payer: Self-pay

## 2019-01-08 ENCOUNTER — Encounter: Payer: Self-pay | Admitting: Family Medicine

## 2019-01-08 ENCOUNTER — Ambulatory Visit (INDEPENDENT_AMBULATORY_CARE_PROVIDER_SITE_OTHER): Payer: PRIVATE HEALTH INSURANCE | Admitting: Family Medicine

## 2019-01-08 VITALS — BP 100/62 | HR 63

## 2019-01-08 DIAGNOSIS — N6310 Unspecified lump in the right breast, unspecified quadrant: Secondary | ICD-10-CM

## 2019-01-08 DIAGNOSIS — N6315 Unspecified lump in the right breast, overlapping quadrants: Secondary | ICD-10-CM | POA: Insufficient documentation

## 2019-01-08 NOTE — Patient Instructions (Signed)
Thank coming in today!  I have ordered a mammogram of your right breast for further evaluation of the breast lump given that it feels like it has enlarged in your family history.  I would like to see you back after the imaging to go over the results and discuss next steps.  Please call the clinic after your imaging to make this appointment.  Take Care,  Dr. Mina Marble, DO Resident Physician [place] [phone]

## 2019-01-08 NOTE — Progress Notes (Signed)
   Subjective:   Patient ID: Erika Decker    DOB: 09-Jan-1986, 33 y.o. female   MRN: [id number]  Erika Decker is a 33 y.o. female here for of "lump on breast"  "Lump on Breast": Patient seen by Dr. Tammi Klippel on 12/17/18 for a concern about a lump on her right breast thought to be 2/2 to fibrocystic changes from hormones related to menstrual cycle. Patient notes since then she still notices the lump, feels like it has gotten a little larger - "feels hard". She also notes her breasts are tender, started last week which is common prior to her cycles. She believes she may be starting her cycle again soon. LMP 12/07/18, due to start this Saturday, however notes her periods are irregular.  Patient has Essure as form of birth control. Patient has history of breast cancer on maternal grandmother, believe she was older in age when she was diagnosed. Denies any weight loss or night sweats. No breast discharge. No changes to breast tissue.   Review of Systems:  Per HPI.   [place], medications and smoking status reviewed.  Objective:   BP 100/62   Pulse 63   SpO2 99%  Vitals and nursing note reviewed.  General: well appearing young female, NAD sitting comfortably on exam bed Neck: supple, normal ROM Lungs: talking in full sentences, normal work of breathing Breast Exam:   Left breast: appears normal, no suspicious masses, no skin or nipples changes or axillary nodes  Right breast: ~1 to 1.5cm nontender but hard to palpation nodule at 12 o'clock position located at [employer], immovable, no skin or nipple changes or axillary nodes   Assessment & Plan:   Breast lump on right side at 12 o'clock position Given endorsement of growth and family history of breast cancer, believe further imaging is warrented. Will order diagnostic mammogram and ultrasound of right breast. Will have patient follow up to review reseults and further evaluation if indicated.    Orders Placed This Encounter  Procedures  . US  BREAST LTD UNI RIGHT INC AXILLA    Standing Status:   Future    Standing Expiration Date:   03/09/2020    Order Specific Question:   Reason for Exam (SYMPTOM  OR DIAGNOSIS REQUIRED)    Answer:   family history of breast cancer    Order Specific Question:   Preferred imaging location?    Answer:   [facility]  . [place] worker Pf: baseline/ no needs/  No implants/  No hx of br ca/ fj/ spoke with office [place] [facility]) [place] for co-sing 01/08/19-fj    Standing Status:   Future    Standing Expiration Date:   03/09/2020    Order Specific Question:   Reason for Exam (SYMPTOM  OR DIAGNOSIS REQUIRED)    Answer:   family history of breast cancer    Order Specific Question:   Is the patient pregnant?    Answer:   No    Order Specific Question:   Preferred imaging location?    Answer:   [place], [place] PGY-1, [facility] 01/08/2019 8:07 PM

## 2019-01-08 NOTE — Assessment & Plan Note (Signed)
Given endorsement of growth and family history of breast cancer, believe further imaging is warrented. Will order diagnostic mammogram and ultrasound of right breast. Will have patient follow up to review reseults and further evaluation if indicated.

## 2019-01-13 ENCOUNTER — Ambulatory Visit: Payer: PRIVATE HEALTH INSURANCE | Admitting: Family Medicine

## 2019-01-19 ENCOUNTER — Ambulatory Visit
Admission: RE | Admit: 2019-01-19 | Discharge: 2019-01-19 | Disposition: A | Discharge status: Home / Self Care | Payer: PRIVATE HEALTH INSURANCE | Source: Ambulatory Visit | Attending: Family Medicine | Admitting: Family Medicine

## 2019-01-19 ENCOUNTER — Ambulatory Visit
Admission: RE | Admit: 2019-01-19 | Discharge: 2019-01-19 | Disposition: A | Discharge status: Home / Self Care | Payer: PRIVATE HEALTH INSURANCE | Source: Ambulatory Visit | Attending: Obstetrics | Admitting: Obstetrics

## 2019-01-19 ENCOUNTER — Other Ambulatory Visit: Payer: Self-pay

## 2019-01-19 ENCOUNTER — Other Ambulatory Visit: Payer: Self-pay | Admitting: Obstetrics

## 2019-01-19 DIAGNOSIS — O365921 Maternal care for other known or suspected poor fetal growth, second trimester, fetus 1: Secondary | ICD-10-CM

## 2019-01-19 DIAGNOSIS — N6315 Unspecified lump in the right breast, overlapping quadrants: Secondary | ICD-10-CM

## 2019-01-19 DIAGNOSIS — O3680X1 Pregnancy with inconclusive fetal viability, fetus 1: Secondary | ICD-10-CM

## 2019-01-21 ENCOUNTER — Telehealth: Payer: Self-pay | Admitting: Family Medicine

## 2019-01-21 NOTE — Telephone Encounter (Signed)
Reviewed mammogram results with patient. No further work up recommended. Patient had no questions or concerns at this time.

## 2019-02-20 ENCOUNTER — Ambulatory Visit (INDEPENDENT_AMBULATORY_CARE_PROVIDER_SITE_OTHER): Payer: PRIVATE HEALTH INSURANCE | Admitting: Family Medicine

## 2019-02-20 ENCOUNTER — Other Ambulatory Visit: Payer: Self-pay

## 2019-02-20 VITALS — BP 94/68 | HR 86

## 2019-02-20 DIAGNOSIS — N939 Abnormal uterine and vaginal bleeding, unspecified: Secondary | ICD-10-CM | POA: Diagnosis not present

## 2019-02-20 HISTORY — DX: Abnormal uterine and vaginal bleeding, unspecified: N93.9

## 2019-02-20 NOTE — Progress Notes (Signed)
   Subjective:    KILIE RUND - 33 y.o. female MRN [id number]  Date of birth: May 18, 1986  CC:  FATOU DUNNIGAN is here for vaginal bleeding.  HPI: She reports that she has had some irregular vaginal bleeding since 1 year ago, but this is the first time that she has completely stopped her period and then restarted again a few days later.  She had a normal period that started on July 12 and stopped on July 18.  She started bleeding again on July 22 and characterized the blood as bright red.  She needed a pad to observe the bleeding the first day, then it went away on July 23 and returned on July 24 with a scant amount of dark brown bleeding.  She denies any vaginal irritation but says that she has had some cramps with this.  She is monogamous with her husband.  She had Essure coils placed in 2016 for permanent contraception.  Her last Pap smear was in 2019 and was normal.  She has not felt fatigued, lightheaded, or weak.  Health Maintenance:  There are no preventive care reminders to display for this patient.  -  reports that she has never smoked. She has never used smokeless tobacco. - Review of Systems: Per HPI. - Past Medical History: Patient Active Problem List   Diagnosis Date Noted  . Abnormal uterine bleeding 02/20/2019  . Breast lump on right side at 12 o'clock position 01/08/2019  . Fibrocystic change of breast, right 12/17/2018   - Medications: reviewed and updated   Objective:   Physical Exam BP 94/68   Pulse 86   LMP 02/08/2019   SpO2 99%  Gen: NAD, alert, cooperative with exam, well-appearing HEENT: NCAT, clear conjunctiva, oropharynx clear, supple neck CV: RRR, good S1/S2, no murmur, no edema Resp: CTABL, no wheezes, non-labored Abd: SNTND, BS present, no guarding or organomegaly, no pelvic discomfort Skin: no rashes, normal turgor  Psych: good insight, alert and oriented        Assessment & Plan:   Abnormal uterine bleeding This may be due to her Essure  coils since irregular menstrual bleeding can be a side effect.  However, we should rule out anatomic causes with a pelvic ultrasound.  We will schedule this today.  Unlikely to be bleeding from the cervix due to her normal Pap 1 year ago.  Unlikely to be an STI or pregnancy given her contraception and lack of risk factors for STIs.  Patient was counseled that we can consider using oral contraception to help with her bleeding in the future if her ultrasound is noncontributory and if she is interested in this option.    Maia Breslow, M.D. 02/20/2019, 2:03 PM PGY-3, [place]

## 2019-02-20 NOTE — Patient Instructions (Addendum)
It was nice meeting you today Ms. Fetterly!  We are scheduling a pelvic and transvaginal ultrasound to look for any cause of your bleeding.  This is likely from your uterus since your last Pap smear is up-to-date and normal.  Your bleeding could be from your Essure since this is a pretty common side effect, but we want to make sure that nothing else could be causing it.  We will let you know what the results are of your ultrasound when they come back.  If you experience very heavy bleeding or feels lightheaded, please let us know.  If you have any questions or concerns, please feel free to call the clinic.   Be well,  Dr. Shan Levans

## 2019-02-20 NOTE — Assessment & Plan Note (Addendum)
This may be due to her Essure coils since irregular menstrual bleeding can be a side effect.  However, we should rule out anatomic causes with a pelvic ultrasound.  We will schedule this today.  Unlikely to be bleeding from the cervix due to her normal Pap 1 year ago.  Unlikely to be an STI or pregnancy given her contraception and lack of risk factors for STIs.  Patient was counseled that we can consider using oral contraception to help with her bleeding in the future if her ultrasound is noncontributory and if she is interested in this option.

## 2019-02-27 ENCOUNTER — Ambulatory Visit (HOSPITAL_COMMUNITY)
Admission: RE | Admit: 2019-02-27 | Discharge: 2019-02-27 | Disposition: A | Discharge status: Home / Self Care | Payer: PRIVATE HEALTH INSURANCE | Source: Ambulatory Visit | Attending: Family Medicine | Admitting: Family Medicine

## 2019-02-27 ENCOUNTER — Other Ambulatory Visit: Payer: Self-pay

## 2019-02-27 DIAGNOSIS — N939 Abnormal uterine and vaginal bleeding, unspecified: Secondary | ICD-10-CM | POA: Diagnosis present

## 2019-03-04 NOTE — Progress Notes (Signed)
   Subjective:    Patient ID: Erika Decker, female    DOB: Nov 11, 1985, 33 y.o.   MRN: [id number]   CC: vaginal discharge  HPI: VAGINAL DISCHARGE Used a different dove soap over the weekend and then noticed irritation.  Onset: Sunday evening    Description: white, thin, sticky   Odor: there was a fishy odor, but not anymore    Itching: yes   Symptoms Dysuria: no  Bleeding: no  Pelvic pain: no  Back pain: no  Fever: no  Genital sores: no  Rash: no  Dyspareunia: yes  GI Sxs: no  Prior treatment: no   Red Flags: Missed period: no  Pregnancy: no  Recent antibiotics: no  Sexual activity: yes, 1 female partner x20 years  Possible STD exposure: no (h/o chlamydia as teenager) IUD: no, has essure  Diabetes: no     Objective:  BP 100/65   Pulse 62   LMP 02/08/2019   SpO2 100%  Vitals and nursing note reviewed  General: well nourished, in no acute distress HEENT: normocephalic Cardiac: RRR, clear S1 and S2, no murmurs, rubs, or gallops Respiratory: clear to auscultation bilaterally, no increased work of breathing Abdomen: soft, nontender, nondistended, no masses or organomegaly. Bowel sounds present Extremities: no edema or cyanosis. Warm, well perfused.  Skin: warm and dry, no rashes noted Neuro: alert and oriented, no focal deficits Female genitalia: normal external genitalia, vulva, vagina, cervix, uterus and adnexa, thick white discharge noted on speculum exam  Assessment & Plan:    Yeast vaginitis Will discharge consistent with yeast.  Wet prep showing yeast.  Given these findings will prescribe diflucan once today and second pill in 72hrs.  Strict return precautions given.  If no improvement follow-up in 1 to 2 weeks.      Return in about 2 weeks (around 03/19/2019), or if symptoms worsen or fail to improve.   Caroline More, DO, PGY-3

## 2019-03-05 ENCOUNTER — Other Ambulatory Visit: Payer: Self-pay

## 2019-03-05 ENCOUNTER — Ambulatory Visit (INDEPENDENT_AMBULATORY_CARE_PROVIDER_SITE_OTHER): Payer: PRIVATE HEALTH INSURANCE | Admitting: Family Medicine

## 2019-03-05 VITALS — BP 100/65 | HR 62

## 2019-03-05 DIAGNOSIS — N898 Other specified noninflammatory disorders of vagina: Secondary | ICD-10-CM

## 2019-03-05 DIAGNOSIS — B373 Candidiasis of vulva and vagina: Secondary | ICD-10-CM

## 2019-03-05 DIAGNOSIS — B3731 Acute candidiasis of vulva and vagina: Secondary | ICD-10-CM | POA: Insufficient documentation

## 2019-03-05 LAB — POCT WET PREP (WET MOUNT)
Clue Cells Wet Prep Whiff POC: NEGATIVE
Trichomonas Wet Prep HPF POC: ABSENT
WBC, Wet Prep HPF POC: >20

## 2019-03-05 MED ORDER — FLUCONAZOLE 150 MG PO TABS: ORAL_TABLET | ORAL | 0 refills | Status: DC

## 2019-03-05 NOTE — Assessment & Plan Note (Signed)
Will discharge consistent with yeast.  Wet prep showing yeast.  Given these findings will prescribe diflucan once today and second pill in 72hrs.  Strict return precautions given.  If no improvement follow-up in 1 to 2 weeks.

## 2019-03-05 NOTE — Patient Instructions (Signed)
Vaginal Yeast infection, Adult  Vaginal yeast infection is a condition that causes vaginal discharge as well as soreness, swelling, and redness (inflammation) of the vagina. This is a common condition. Some women get this infection frequently. What are the causes? This condition is caused by a change in the normal balance of the yeast (candida) and bacteria that live in the vagina. This change causes an overgrowth of yeast, which causes the inflammation. What increases the risk? The condition is more likely to develop in women who:  Take antibiotic medicines.  Have diabetes.  Take birth control pills.  Are pregnant.  Douche often.  Have a weak body defense system (immune system).  Have been taking steroid medicines for a long time.  Frequently wear tight clothing. What are the signs or symptoms? Symptoms of this condition include:  White, thick, creamy vaginal discharge.  Swelling, itching, redness, and irritation of the vagina. The lips of the vagina (vulva) may be affected as well.  Pain or a burning feeling while urinating.  Pain during sex. How is this diagnosed? This condition is diagnosed based on:  Your medical history.  A physical exam.  A pelvic exam. Your health care provider will examine a sample of your vaginal discharge under a microscope. Your health care provider may send this sample for testing to confirm the diagnosis. How is this treated? This condition is treated with medicine. Medicines may be over-the-counter or prescription. You may be told to use one or more of the following:  Medicine that is taken by mouth (orally).  Medicine that is applied as a cream (topically).  Medicine that is inserted directly into the vagina (suppository). Follow these instructions at home:  Lifestyle  Do not have sex until your health care provider approves. Tell your sex partner that you have a yeast infection. That person should go to his or her health care  provider and ask if they should also be treated.  Do not wear tight clothes, such as pantyhose or tight pants.  Wear breathable cotton underwear. General instructions  Take or apply over-the-counter and prescription medicines only as told by your health care provider.  Eat more yogurt. This may help to keep your yeast infection from returning.  Do not use tampons until your health care provider approves.  Try taking a sitz bath to help with discomfort. This is a warm water bath that is taken while you are sitting down. The water should only come up to your hips and should cover your buttocks. Do this 3-4 times per day or as told by your health care provider.  Do not douche.  If you have diabetes, keep your blood sugar levels under control.  Keep all follow-up visits as told by your health care provider. This is important. Contact a health care provider if:  You have a fever.  Your symptoms go away and then return.  Your symptoms do not get better with treatment.  Your symptoms get worse.  You have new symptoms.  You develop blisters in or around your vagina.  You have blood coming from your vagina and it is not your menstrual period.  You develop pain in your abdomen. Summary  Vaginal yeast infection is a condition that causes discharge as well as soreness, swelling, and redness (inflammation) of the vagina.  This condition is treated with medicine. Medicines may be over-the-counter or prescription.  Take or apply over-the-counter and prescription medicines only as told by your health care provider.  Do not douche.   Do not have sex or use tampons until your health care provider approves.  Contact a health care provider if your symptoms do not get better with treatment or your symptoms go away and then return. This information is not intended to replace advice given to you by your health care provider. Make sure you discuss any questions you have with your health care  provider. Document Released: 04/25/2005 Document Revised: 12/02/2017 Document Reviewed: 12/02/2017 Elsevier Patient Education  2020 Elsevier Inc.  

## 2019-03-10 ENCOUNTER — Telehealth: Payer: Self-pay | Admitting: *Deleted

## 2019-03-10 NOTE — Telephone Encounter (Signed)
Pt informed of below and appointment scheduled for 03/13/2019 with PCP to discuss this.April Zimmerman Rumple, CMA

## 2019-03-10 NOTE — Telephone Encounter (Signed)
-----   Message from Kathrene Alu, MD sent at 03/09/2019 12:04 PM EDT ----- Would you let Ms. Casamento know that her ultrasound looked normal, but we should see her in our clinic to look to see if there are other causes of her bleeding.  We may want to discuss medications that will help improve her bleeding at that time as well.  Thank you.

## 2019-03-13 ENCOUNTER — Ambulatory Visit (INDEPENDENT_AMBULATORY_CARE_PROVIDER_SITE_OTHER): Payer: PRIVATE HEALTH INSURANCE | Admitting: Family Medicine

## 2019-03-13 ENCOUNTER — Other Ambulatory Visit: Payer: Self-pay

## 2019-03-13 VITALS — BP 115/70 | HR 64

## 2019-03-13 DIAGNOSIS — R42 Dizziness and giddiness: Secondary | ICD-10-CM | POA: Diagnosis not present

## 2019-03-13 DIAGNOSIS — N939 Abnormal uterine and vaginal bleeding, unspecified: Secondary | ICD-10-CM

## 2019-03-13 LAB — POCT GLYCOSYLATED HEMOGLOBIN (HGB A1C): Hemoglobin A1C: 5.6 % (ref 4.0–5.6)

## 2019-03-13 NOTE — Progress Notes (Signed)
   Subjective:    Patient ID: Erika Decker, female    DOB: 10-12-1985, 33 y.o.   MRN: [id number]   CC: f/u AUB and lightheadedness   HPI: AUB Seen on 7/24 by Dr. Shan Levans.  Has a history of issue of coils placed in 2016 for permanent contraception.  Last Pap smear from 2019 was normal.  Pelvic ultrasound showing endometrial thickness of 11 mm, within normal limits for woman of reproductive age.  Recommended sonohysterogram if bleeding is unresponsive to hormonal therapy.  Patient reports she has not regulated and is back to her normal cycle. States that her bleeding stopped the day she saw Dr. Shan Levans. Is currently on her cycle but it is now light/spotting and is supposed to end tomorrow. Did report some cramps on Tuesday.   Lightheadedness Patient reports lightheadedness x1 week. Reports it can occur when she is standing or laying down. Has been drinking lots of water, more than normal. Reports she feels very thirsty all the time. Reports palpitations during episodes as well. Reports the room appears to be spinning. This can happen at any time of the day. Denies LOC. Denies nausea, vomiting, diarrhea. Mother and maternal aunt have a history of thyroid dysfunction. Reports good appetite.   Objective:  BP 115/70   Pulse 64   SpO2 99%  Vitals and nursing note reviewed  General: well nourished, in no acute distress HEENT: normocephalic, PERRL, EOMI, no scleral icterus or conjunctival pallor, no nasal discharge, moist mucous membranes, good dentition without erythema or discharge noted in posterior oropharynx, uvula midline Neck: supple, non-tender, without lymphadenopathy, full ROM Cardiac: RRR, clear S1 and S2, no murmurs, rubs, or gallops Respiratory: clear to auscultation bilaterally, no increased work of breathing Abdomen: soft, nontender, nondistended, no masses or organomegaly. Bowel sounds present Extremities: no edema or cyanosis. Warm, well perfused. 2+ radial and PT pulses  bilaterally Skin: warm and dry, no rashes noted Neuro: alert and oriented, no focal deficits, CN2-12 intact, sensation intact bilaterally, 5/5 muscle strength, normal grip strength, normal gait   Assessment & Plan:    Abnormal uterine bleeding Resolved. Likely 2/2 essure. Advised patient to follow up if patient continues to have irregular bleeding.   Lightheadedness Unclear etiology at this time. Can consider anemia given recent h/o AUB. Will obtain CBC to rule this out. Can consider thyroid dysfunction given history of palpitations with strong family history of thyroid dysfunction, will obtain thyroid panel to rule this out. Can consider electrolyte abnormality, especially given recent increase in water intake. Will obtain BMP. Given history of excessive thirst will also obtain A1C to r/o diabetes. If w/u is negative can consider 48hr holter monitor. Strict return precautions given. Follow up in 2 weeks.     Return in about 2 weeks (around 03/27/2019).   Caroline More, DO, PGY-3

## 2019-03-13 NOTE — Patient Instructions (Signed)
It was a pleasure seeing you today.   Today we discussed your dizziness  For your dizziness: I have ordered blood work.  I will call you with these results.  Please follow-up in 2 weeks we can go over your blood work and see if you are improving.  At that time we may order further testing if you are still having symptoms.  Please follow up in 2 weeks or sooner if symptoms persist or worsen. Please call the clinic immediately if you have any concerns.   Our clinic's number is [phone]. Please call with questions or concerns.   Please go to the emergency room if you have worsening dizziness, loss of consciousness, chest pain, shortness of breath  Thank you,  Caroline More, DO

## 2019-03-14 LAB — CBC
Hematocrit: 35.8 % (ref 34.0–46.6)
Hemoglobin: 11.9 g/dL (ref 11.1–15.9)
MCH: 28.1 pg (ref 26.6–33.0)
MCHC: 33.2 g/dL (ref 31.5–35.7)
MCV: 85 fL (ref 79–97)
Platelets: 271 10*3/uL (ref 150–450)
RBC: 4.23 x10E6/uL (ref 3.77–5.28)
RDW: 13.1 % (ref 11.7–15.4)
WBC: 4.7 10*3/uL (ref 3.4–10.8)

## 2019-03-14 LAB — BASIC METABOLIC PANEL
BUN/Creatinine Ratio: 15 (ref 9–23)
BUN: 12 mg/dL (ref 6–20)
CO2: 22 mmol/L (ref 20–29)
Calcium: 9.4 mg/dL (ref 8.7–10.2)
Chloride: 104 mmol/L (ref 96–106)
Creatinine, Ser: 0.82 mg/dL (ref 0.57–1.00)
GFR calc Af Amer: 110 mL/min/{1.73_m2} (ref >59)
GFR calc non Af Amer: 96 mL/min/{1.73_m2} (ref >59)
Glucose: 87 mg/dL (ref 65–99)
Potassium: 4.1 mmol/L (ref 3.5–5.2)
Sodium: 140 mmol/L (ref 134–144)

## 2019-03-14 LAB — TSH+FREE T4
Free T4: 0.98 ng/dL (ref 0.82–1.77)
TSH: 1.05 u[IU]/mL (ref 0.450–4.500)

## 2019-03-14 NOTE — Assessment & Plan Note (Signed)
Resolved. Likely 2/2 essure. Advised patient to follow up if patient continues to have irregular bleeding.

## 2019-03-14 NOTE — Assessment & Plan Note (Signed)
Unclear etiology at this time. Can consider anemia given recent h/o AUB. Will obtain CBC to rule this out. Can consider thyroid dysfunction given history of palpitations with strong family history of thyroid dysfunction, will obtain thyroid panel to rule this out. Can consider electrolyte abnormality, especially given recent increase in water intake. Will obtain BMP. Given history of excessive thirst will also obtain A1C to r/o diabetes. If w/u is negative can consider 48hr holter monitor. Strict return precautions given. Follow up in 2 weeks.

## 2019-03-15 NOTE — Addendum Note (Signed)
Addended by: Owens Shark, Alexee Delsanto on: 03/15/2019 06:19 AM   Modules accepted: Level of Service

## 2019-03-16 ENCOUNTER — Telehealth: Payer: Self-pay

## 2019-03-16 NOTE — Telephone Encounter (Signed)
Informed patient of lab results.  .Erika Decker R Haylen Shelnutt, CMA  

## 2019-03-16 NOTE — Telephone Encounter (Signed)
Informed patient of lab result.  Erika Decker, [place]

## 2019-03-19 ENCOUNTER — Other Ambulatory Visit: Payer: Self-pay | Admitting: Family Medicine

## 2019-03-19 ENCOUNTER — Encounter: Payer: Self-pay | Admitting: Family Medicine

## 2019-03-19 DIAGNOSIS — R42 Dizziness and giddiness: Secondary | ICD-10-CM

## 2019-03-19 NOTE — Progress Notes (Signed)
Patient with continued lightheadedness.  Was confused by her lab work was all normal if she still having symptoms.  I explained to her we may need to have a Holter monitor.  Patient reports that she would like to see a cardiologist because her lightheadedness is worsening.  I will place referral to cardiology and have placed in the comments that she will likely need a Holter monitor.  Strict return precautions given.  Dalphine Handing, PGY-3 [facility] 03/19/2019 1:27 PM

## 2019-04-20 ENCOUNTER — Ambulatory Visit (INDEPENDENT_AMBULATORY_CARE_PROVIDER_SITE_OTHER): Payer: PRIVATE HEALTH INSURANCE | Admitting: Family Medicine

## 2019-04-20 ENCOUNTER — Encounter: Payer: Self-pay | Admitting: Family Medicine

## 2019-04-20 ENCOUNTER — Other Ambulatory Visit: Payer: Self-pay

## 2019-04-20 DIAGNOSIS — G4485 Primary stabbing headache: Secondary | ICD-10-CM

## 2019-04-20 DIAGNOSIS — R519 Headache, unspecified: Secondary | ICD-10-CM | POA: Insufficient documentation

## 2019-04-20 MED ORDER — NAPROXEN 500 MG PO TABS: 500.0000 mg | ORAL_TABLET | Freq: Two times a day (BID) | ORAL | 0 refills | Status: DC

## 2019-04-20 NOTE — Progress Notes (Signed)
     Subjective: Chief Complaint  Patient presents with  . Headache    HPI: Erika Decker is a 33 y.o. presenting to clinic today to discuss the following:  Headache Patient has PMH of vertigo who presents today for new headache. First started Saturday behind her left ear with sharp pain that then went to the right side of her head that then went "all over". She has never had headaches like this before. She states it went from constant to now intermittent with episodes of sharp pain now lasting several seconds occurring everyday multiple times per day. She tried laying down and taking tylenol which did not help. She denies blurry vision, nausea, vomiting, photophobia, phonophobia, syncope, unsteady gait, fever, or chills.  ROS noted in HPI.    Social History   Tobacco Use  Smoking Status Never Smoker  Smokeless Tobacco Never Used    Objective: BP 108/72   Pulse 87   SpO2 98%  Vitals and nursing notes reviewed  Physical Exam Gen: Alert and Oriented x 3, NAD HEENT: Normocephalic, atraumatic, PERRLA, EOMI, TM visible with good light reflex Ext: no clubbing, cyanosis, or edema Neuro: CN II-XII intact, 5/5 Upper and Lower Extremity strength bilaterally, gross sensation intact bilaterally, +2 reflexes in the upper and lower extremities bilaterally Skin: warm, dry, intact, no rashes  Assessment/Plan:  Headache Tension vs Cluster vs Migraine type headache all considered. Unlikely to be cluster given no autonomic symptoms and pain is bilateral. Migraine less likely due to no associated nausea, vomiting, photophobia, or phonophobia, and short duration of repeating symptoms. - Will treat as tension type headache with rest, adequate hydration, Naproxen, and discussed ways to reduce life stress - If no improvement or worsening symptoms consider imaging   PATIENT EDUCATION PROVIDED: See AVS    Diagnosis and plan along with any newly prescribed medication(s) were discussed in detail  with this patient today. The patient verbalized understanding and agreed with the plan. Patient advised if symptoms worsen return to clinic or ER.    Meds ordered this encounter  Medications  . naproxen (NAPROSYN) 500 MG tablet    Sig: Take 1 tablet (500 mg total) by mouth 2 (two) times daily with a meal.    Dispense:  30 tablet    Refill:  0    Erika Rutherford, DO 04/20/2019, 2:12 PM PGY-3 [facility]

## 2019-04-20 NOTE — Patient Instructions (Signed)
Tension Headache, Adult A tension headache is pain, pressure, or aching in your head. Tension headaches can last from 30 minutes to several days. Follow these instructions at home: Managing pain  Take over-the-counter and prescription medicines only as told by your doctor.  When you have a headache, lie down in a dark, quiet room.  If told, put ice on your head and neck: ? Put ice in a plastic bag. ? Place a towel between your skin and the bag. ? Leave the ice on for 20 minutes, 2-3 times a day.  If told, put heat on the back of your neck. Do this as often as your doctor tells you to. Use the kind of heat that your doctor recommends, such as a moist heat pack or a heating pad. ? Place a towel between your skin and the heat. ? Leave the heat on for 20-30 minutes. ? Remove the heat if your skin turns bright red. Eating and drinking  Eat meals on a regular schedule.  Watch how much alcohol you drink: ? If you are a woman and are not pregnant, do not drink more than 1 drink a day. ? If you are a man, do not drink more than 2 drinks a day.  Drink enough fluid to keep your pee (urine) pale yellow.  Do not use a lot of caffeine, or stop using caffeine. Lifestyle  Get enough sleep. Get 7-9 hours of sleep each night. Or get the amount of sleep that your doctor tells you to.  At bedtime, remove all electronic devices from your room. Examples of electronic devices are computers, phones, and tablets.  Find ways to lessen your stress. Some things that can lessen stress are: ? Exercise. ? Deep breathing. ? Yoga. ? Music. ? Positive thoughts.  Sit up straight. Do not tighten (tense) your muscles.  Do not use any products that have nicotine or tobacco in them, such as cigarettes and e-cigarettes. If you need help quitting, ask your doctor. General instructions   Keep all follow-up visits as told by your doctor. This is important.  Avoid things that can bring on headaches. Keep a  journal to find out if certain things bring on headaches. For example, write down: ? What you eat and drink. ? How much sleep you get. ? Any change to your diet or medicines. Contact a doctor if:  Your headache does not get better.  Your headache comes back.  You have a headache and sounds, light, or smells bother you.  You feel sick to your stomach (nauseous) or you throw up (vomit).  Your stomach hurts. Get help right away if:  You suddenly get a very bad headache along with any of these: ? A stiff neck. ? Feeling sick to your stomach. ? Throwing up. ? Feeling weak. ? Trouble seeing. ? Feeling short of breath. ? A rash. ? Feeling unusually sleepy. ? Trouble speaking. ? Pain in your eye or ear. ? Trouble walking or balancing. ? Feeling like you will pass out (faint). ? Passing out. Summary  A tension headache is pain, pressure, or aching in your head.  Tension headaches can last from 30 minutes to several days.  Lifestyle changes and medicines may help relieve pain. This information is not intended to replace advice given to you by your health care provider. Make sure you discuss any questions you have with your health care provider. Document Released: 10/10/2009 Document Revised: 06/28/2017 Document Reviewed: 10/26/2016 Elsevier Patient Education  2020 Elsevier   Inc.  

## 2019-04-20 NOTE — Assessment & Plan Note (Signed)
Tension vs Cluster vs Migraine type headache all considered. Unlikely to be cluster given no autonomic symptoms and pain is bilateral. Migraine less likely due to no associated nausea, vomiting, photophobia, or phonophobia, and short duration of repeating symptoms. - Will treat as tension type headache with rest, adequate hydration, Naproxen, and discussed ways to reduce life stress - If no improvement or worsening symptoms consider imaging

## 2019-05-05 ENCOUNTER — Ambulatory Visit (INDEPENDENT_AMBULATORY_CARE_PROVIDER_SITE_OTHER): Payer: PRIVATE HEALTH INSURANCE | Admitting: Cardiology

## 2019-05-05 ENCOUNTER — Other Ambulatory Visit: Payer: Self-pay

## 2019-05-05 VITALS — BP 104/78 | HR 70 | Ht 61.0 in | Wt 143.8 lb

## 2019-05-05 DIAGNOSIS — R002 Palpitations: Secondary | ICD-10-CM | POA: Diagnosis not present

## 2019-05-05 DIAGNOSIS — R42 Dizziness and giddiness: Secondary | ICD-10-CM

## 2019-05-05 NOTE — Progress Notes (Signed)
Cardiology Office Note:    Date:  05/05/2019   ID:  [name], DOB Dec 05, 1985, MRN [id number]  PCP:  Caroline More, DO  Cardiologist:  No primary care provider on file.  Electrophysiologist:  None   Referring MD: Erika Rio, MD   Chief Complaint  Patient presents with  . Dizziness    History of Present Illness:    Erika Decker is a 33 y.o. female with no significant past medical history who is referred by Dr Ardelia Mems for an evaluation of lightheadedness.  Started 3 months ago.  States that has episodes of dizziness where feels like room is spinnning.  None in last 2 weeks, until today when she reported that she felt like room was spinning when she bent her head down.  Episodes occur at rest.  No syncopal episodes.  Lasts about 1 minute and resolves spontenously.  Denies chest pain or dyspnea.  Steates that she feels heart is racing during episodes.    States that she saw a cardiologist in 2011 due to lightheadedness.  Wore a monitor and was unremarkable, though said she didn't have an episode while wearing.  Also underwent ETT that was unremarkable.  Past Medical History:  Diagnosis Date  . Abnormal Pap smear 2011   Colpo > paps nml after  . Dizziness   . Medical history non-contributory   . Vaginal Pap smear, abnormal     Past Surgical History:  Procedure Laterality Date  . COLPOSCOPY    . TUBAL LIGATION Bilateral 03/19/2014   Procedure: ESSURE TUBAL STERILIZATION;  Surgeon: Erika Crocker, MD;  Location: [facility];  Service: Gynecology;  Laterality: Bilateral;    Current Medications: No outpatient medications have been marked as taking for the 05/05/19 encounter (Office Visit) with Donato Heinz, MD.     Allergies:   Patient has no known allergies.   Social History   Socioeconomic History  . Marital status: Married    Spouse name: Not on file  . Number of children: Not on file  . Years of education: Not on file  . Highest education  level: Not on file  Occupational History  . Not on file  Social Needs  . Financial resource strain: Not on file  . Food insecurity    Worry: Not on file    Inability: Not on file  . Transportation needs    Medical: Not on file    Non-medical: Not on file  Tobacco Use  . Smoking status: Never Smoker  . Smokeless tobacco: Never Used  Substance and Sexual Activity  . Alcohol use: Yes    Alcohol/week: 0.0 standard drinks    Comment: once a year on birthday (1 daquiri)   . Drug use: No  . Sexual activity: Yes    Partners: Male    Birth control/protection: Surgical    Comment: Tubal Ligation    Lifestyle  . Physical activity    Days per week: Not on file    Minutes per session: Not on file  . Stress: Not on file  Relationships  . Social Herbalist on phone: Not on file    Gets together: Not on file    Attends religious service: Not on file    Active member of club or organization: Not on file    Attends meetings of clubs or organizations: Not on file    Relationship status: Not on file  Other Topics Concern  . Not on file  Social History  Narrative  . Not on file     Family History: The patient's family history includes Asthma in her brother; Breast cancer in her maternal grandmother; Depression in her brother; Diabetes in her mother; Heart murmur in her brother; Hypertension in her father and mother; Lung cancer in her mother; Stroke in her father and mother.  ROS:   Please see the history of present illness.     All other systems reviewed and are negative.  EKGs/Labs/Other Studies Reviewed:    The following studies were reviewed today:   EKG:  EKG is ordered today.  The ekg ordered today demonstrates NSR with sinus arrhythmia, rate 70, nonspecific T wave flattening  Recent Labs: 03/13/2019: BUN 12; Creatinine, Ser 0.82; Hemoglobin 11.9; Platelets 271; Potassium 4.1; Sodium 140; TSH 1.050  Recent Lipid Panel No results found for: CHOL, TRIG, HDL, CHOLHDL,  VLDL, LDLCALC, LDLDIRECT  Physical Exam:    VS:  BP 104/78   Pulse 70   Ht 5\' 1"  (1.549 m)   Wt 143 lb 12.8 oz (65.2 kg)   SpO2 96%   BMI 27.17 kg/m     Wt Readings from Last 3 Encounters:  05/05/19 143 lb 12.8 oz (65.2 kg)  06/25/18 141 lb 6.4 oz (64.1 kg)  05/26/18 139 lb 3.2 oz (63.1 kg)   Orthostatics: Lying 104/69 67 Sitting 107/67 82 Standing 111/78 87 Standing for 3 minutes: 114/71 96  GEN: Well nourished, well developed in no acute distress HEENT: Normal NECK: No JVD; No carotid bruits LYMPHATICS: No lymphadenopathy CARDIAC: RRR, no murmurs, rubs, gallops RESPIRATORY:  Clear to auscultation without rales, wheezing or rhonchi  ABDOMEN: Soft, non-tender, non-distended MUSCULOSKELETAL:  No edema; No deformity  SKIN: Warm and dry NEUROLOGIC:  Alert and oriented x 3 PSYCHIATRIC:  Normal affect   ASSESSMENT:    1. Palpitations   2. Lightheadedness    PLAN:    In order of problems listed above:  Palpitations: Describes feeling like heart is racing during episodes of lightheadedness.  Unclear whether palpitations precede the lightheadedness or vice versa - 30 day monitor - TTE  Lightheadedness: Could be related to arrhythmia as above.  If cardiac work-up is negative, suspect likely BPPV given her description of room spinning and episodes can be preceded by certain head movements  Medication Adjustments/Labs and Tests Ordered: Current medicines are reviewed at length with the patient today.  Concerns regarding medicines are outlined above.  Orders Placed This Encounter  Procedures  . Cardiac event monitor  . EKG 12-Lead  . ECHOCARDIOGRAM COMPLETE   No orders of the defined types were placed in this encounter.   Patient Instructions  Medication Instructions:  No changes   Lab work: None ordered   Testing/Procedures: Schedule Echo Schedule 30 day event monitor  Follow-Up: At [facility], you and your health needs are our priority.  As part of  our continuing mission to provide you with exceptional heart care, we have created designated Provider Care Teams.  These Care Teams include your primary Cardiologist (physician) and Advanced Practice Providers (APPs -  Physician Assistants and Nurse Practitioners) who all work together to provide you with the care you need, when you need it. . Follow Up appointment to be determined after test      Signed, [facility], MD  05/05/2019 5:14 PM    [facility]

## 2019-05-05 NOTE — Patient Instructions (Signed)
Medication Instructions:  No changes   Lab work: None ordered   Testing/Procedures: Schedule Echo Schedule 30 day event monitor  Follow-Up: At [facility], you and your health needs are our priority.  As part of our continuing mission to provide you with exceptional heart care, we have created designated Provider Care Teams.  These Care Teams include your primary Cardiologist (physician) and Advanced Practice Providers (APPs -  Physician Assistants and Nurse Practitioners) who all work together to provide you with the care you need, when you need it. . Follow Up appointment to be determined after test

## 2019-05-08 ENCOUNTER — Other Ambulatory Visit: Payer: Self-pay

## 2019-05-08 ENCOUNTER — Ambulatory Visit (HOSPITAL_COMMUNITY): Payer: PRIVATE HEALTH INSURANCE | Attending: Cardiology

## 2019-05-08 DIAGNOSIS — R002 Palpitations: Secondary | ICD-10-CM | POA: Diagnosis present

## 2019-05-13 ENCOUNTER — Telehealth: Payer: Self-pay

## 2019-05-13 NOTE — Telephone Encounter (Signed)
Left detailed message with monitor instructions and phone number to [employer] for pt to call to set up payment arrangements because she is Self-pay. 30 day [employer] Event monitor was ordered to be mailed to pt's home address.

## 2019-05-21 ENCOUNTER — Ambulatory Visit (INDEPENDENT_AMBULATORY_CARE_PROVIDER_SITE_OTHER): Payer: PRIVATE HEALTH INSURANCE

## 2019-05-21 DIAGNOSIS — R002 Palpitations: Secondary | ICD-10-CM | POA: Diagnosis not present

## 2019-06-11 ENCOUNTER — Telehealth: Payer: Self-pay | Admitting: *Deleted

## 2019-06-11 NOTE — Telephone Encounter (Signed)
Pt informed and agreeable.  Appt made for Monday. Christen Bame, CMA

## 2019-06-11 NOTE — Telephone Encounter (Signed)
I agree, patient will need at least a telemed visit so we can get more history. Would prefer in person to do testing. If anyone cancels please schedule patient. In the meant time I agree, OTC monistat.   Can we schedule her for Monday if no openings today?  Dalphine Handing, PGY-3 [facility] 06/11/2019 10:01 AM

## 2019-06-11 NOTE — Telephone Encounter (Signed)
Pt has a white discharge and vaginal itching.  Advised that typically we have pt make an appt, but we have no openings for today or tomorrow.  I will send message to PCP, but did inform pt that she could try some OTC monistat which she has not tried yet.  To PCP. Christen Bame, CMA

## 2019-06-15 ENCOUNTER — Ambulatory Visit (INDEPENDENT_AMBULATORY_CARE_PROVIDER_SITE_OTHER): Payer: PRIVATE HEALTH INSURANCE | Admitting: Family Medicine

## 2019-06-15 ENCOUNTER — Other Ambulatory Visit: Payer: Self-pay

## 2019-06-15 VITALS — BP 106/62 | HR 90 | Ht 61.0 in | Wt 146.1 lb

## 2019-06-15 DIAGNOSIS — N898 Other specified noninflammatory disorders of vagina: Secondary | ICD-10-CM | POA: Diagnosis not present

## 2019-06-15 DIAGNOSIS — B373 Candidiasis of vulva and vagina: Secondary | ICD-10-CM

## 2019-06-15 DIAGNOSIS — B3731 Acute candidiasis of vulva and vagina: Secondary | ICD-10-CM

## 2019-06-15 LAB — POCT WET PREP (WET MOUNT)
Clue Cells Wet Prep Whiff POC: NEGATIVE
Trichomonas Wet Prep HPF POC: ABSENT

## 2019-06-15 MED ORDER — FLUCONAZOLE 150 MG PO TABS: ORAL_TABLET | ORAL | 0 refills | Status: DC

## 2019-06-15 NOTE — Progress Notes (Signed)
    Subjective:  Erika Decker is a 33 y.o. female who presents to the [facility] today with a chief complaint of vaginal discharge.   HPI:  VAGINAL DISCHARGE  Having vaginal discharge for  1.5 weeks Discharge consistency: Thick Discharge color: White Medications tried: None  Recent antibiotic use: No Possible STD exposure: Patient is in monogamous relationship, declines STI testing.  Symptoms Fever: No Dysuria: No Vaginal bleeding: No Abdomen or Pelvic pain: No Back pain: No Genital sores or ulcers: No Rash: No  ROS see HPI Smoking Status noted   Objective:  Physical Exam: BP 106/62   Pulse 90   Ht 5\' 1"  (1.549 m)   Wt 146 lb 2 oz (66.3 kg)   SpO2 98%   BMI 27.61 kg/m   Gen: NAD, resting comfortably GI: Soft, Nontender, Nondistended. MSK: no edema, cyanosis, or clubbing noted Skin: warm, dry GU: No lesions, normal-appearing labia, normal-appearing vagina, normal-appearing cervix, no CMT, bimanual exam without any abnormal masses Neuro: grossly normal, moves all extremities Psych: Normal affect and thought content  Results for orders placed or performed in visit on 06/15/19 (from the past 72 hour(s))  POCT Wet Prep [facility])     Status: Abnormal   Collection Time: 06/15/19  2:55 PM  Result Value Ref Range   Source Wet Prep POC VAG    WBC, Wet Prep HPF POC 5-10    Bacteria Wet Prep HPF POC Moderate (A) Few   Clue Cells Wet Prep HPF POC None None   Clue Cells Wet Prep Whiff POC Negative Whiff    Yeast Wet Prep HPF POC Moderate (A) None   KOH Wet Prep POC Moderate (A) None   Trichomonas Wet Prep HPF POC Absent Absent   Chaperoned by Cherrie Distance, CMA  Assessment/Plan:  Vagina, candidiasis Patient's lab consistent with vaginal candidiasis.  Declined STI testing.  Patient before wet prep results returned.  We will have staff call to inform patient.  Will treat with Diflucan regimen.     Lab Orders     POCT Wet Prep [facility])  Meds ordered this encounter   Medications  . fluconazole (DIFLUCAN) 150 MG tablet    Sig: Take 1 tablet now and then a second tablet in 72 hrs.    Dispense:  2 tablet    Refill:  0      Marny Lowenstein, MD, MS FAMILY MEDICINE RESIDENT - PGY3 06/15/2019 4:24 PM

## 2019-06-15 NOTE — Progress Notes (Signed)
Attempted to reach pt. LVM of note. Joice Nazario, CMA  

## 2019-06-15 NOTE — Assessment & Plan Note (Signed)
Patient's lab consistent with vaginal candidiasis.  Declined STI testing.  Patient before wet prep results returned.  We will have staff call to inform patient.  Will treat with Diflucan regimen.

## 2019-08-10 ENCOUNTER — Telehealth: Payer: Self-pay

## 2019-08-10 NOTE — Telephone Encounter (Signed)
Pt calling nurse line regarding symptoms of yeast infection. Symptom onset began Saturday 08/08/19. Pt states that she has been having issues with recurrent yeast infections since last pregnancy. Patient wanted to see if she could get rx called in or if she would need to be seen in the office.   Please advise.   To PCP

## 2019-08-10 NOTE — Telephone Encounter (Signed)
Patient should be seen in clinic as she would benefit from a wet prep to confirm. H/o recent yeast infection.   Orpah Clinton, PGY-3 [facility] 08/10/2019 2:34 PM

## 2019-08-11 ENCOUNTER — Ambulatory Visit (INDEPENDENT_AMBULATORY_CARE_PROVIDER_SITE_OTHER): Payer: PRIVATE HEALTH INSURANCE | Admitting: Family Medicine

## 2019-08-11 ENCOUNTER — Other Ambulatory Visit: Payer: Self-pay

## 2019-08-11 VITALS — BP 117/80 | HR 83 | Wt 150.8 lb

## 2019-08-11 DIAGNOSIS — B379 Candidiasis, unspecified: Secondary | ICD-10-CM | POA: Diagnosis not present

## 2019-08-11 DIAGNOSIS — N898 Other specified noninflammatory disorders of vagina: Secondary | ICD-10-CM | POA: Diagnosis not present

## 2019-08-11 LAB — POCT WET PREP (WET MOUNT)
Clue Cells Wet Prep Whiff POC: NEGATIVE
Trichomonas Wet Prep HPF POC: ABSENT
WBC, Wet Prep HPF POC: >20

## 2019-08-11 MED ORDER — FLUCONAZOLE 150 MG PO TABS: ORAL_TABLET | ORAL | 3 refills | Status: DC

## 2019-08-11 NOTE — Telephone Encounter (Signed)
Pt called and informed and scheduled for office visit this afternoon 08/11/19.   Veronda Prude, RN

## 2019-08-11 NOTE — Assessment & Plan Note (Signed)
Wet prep consistent with vaginal yeast infection.  Patient seems to get these frequently with her menses.  I will give Diflucan as well as multiple refills to take if she gets frequent infections.  Advised to not use tampons, use loosefitting clothing, cotton underwear.  Have also advised to use a oral probiotic daily.  Strict return precautions given.  Follow-up if no improvement.

## 2019-08-11 NOTE — Progress Notes (Signed)
   Subjective:    Patient ID: Erika Decker, female    DOB: 15-Sep-1985, 34 y.o.   MRN: [id number]   CC: Vaginal discharge  HPI: VAGINAL DISCHARGE Onset: Saturday    Description: white, thick. Feels exactly like her previous yeast infections   Odor: none   Itching: yes   Symptoms Dysuria: no  Bleeding: no  Pelvic pain: no  Back pain: no  Fever: no  Genital sores: no  Rash: no  Dyspareunia: yes  GI Sxs: no  Prior treatment: no   Red Flags: Missed period: no  Pregnancy: no  Recent antibiotics: no  Sexual activity: yes  Possible STD exposure: no  IUD: no  Diabetes: no   Patient wears cotton panties. Does not use douching. Wears loose fitting clothes.     Objective:  BP 117/80   Pulse 83   Wt 150 lb 12.8 oz (68.4 kg)   SpO2 99%   BMI 28.49 kg/m  Vitals and nursing note reviewed  General: well nourished, in no acute distress HEENT: normocephalic  Neck: supple  Cardiac: Regular rate Respiratory: no increased work of breathing Extremities: no edema or cyanosis. Warm, well perfused.  Skin: warm and dry, no rashes noted Neuro: alert and oriented, no focal deficits Female genitalia: normal external genitalia, vulva, vagina, cervix, uterus and adnexa. Thick white discharge noted  Assessment & Plan:    Yeast infection Wet prep consistent with vaginal yeast infection.  Patient seems to get these frequently with her menses.  I will give Diflucan as well as multiple refills to take if she gets frequent infections.  Advised to not use tampons, use loosefitting clothing, cotton underwear.  Have also advised to use a oral probiotic daily.  Strict return precautions given.  Follow-up if no improvement.    Return in about 2 weeks (around 08/25/2019), or if symptoms worsen or fail to improve.   [name], DO, PGY-3

## 2019-08-11 NOTE — Patient Instructions (Signed)
Vaginal Yeast Infection, Adult  Vaginal yeast infection is a condition that causes vaginal discharge as well as soreness, swelling, and redness (inflammation) of the vagina. This is a common condition. Some women get this infection frequently. What are the causes? This condition is caused by a change in the normal balance of the yeast (candida) and bacteria that live in the vagina. This change causes an overgrowth of yeast, which causes the inflammation. What increases the risk? The condition is more likely to develop in women who:  Take antibiotic medicines.  Have diabetes.  Take birth control pills.  Are pregnant.  Douche often.  Have a weak body defense system (immune system).  Have been taking steroid medicines for a long time.  Frequently wear tight clothing. What are the signs or symptoms? Symptoms of this condition include:  White, thick, creamy vaginal discharge.  Swelling, itching, redness, and irritation of the vagina. The lips of the vagina (vulva) may be affected as well.  Pain or a burning feeling while urinating.  Pain during sex. How is this diagnosed? This condition is diagnosed based on:  Your medical history.  A physical exam.  A pelvic exam. Your health care provider will examine a sample of your vaginal discharge under a microscope. Your health care provider may send this sample for testing to confirm the diagnosis. How is this treated? This condition is treated with medicine. Medicines may be over-the-counter or prescription. You may be told to use one or more of the following:  Medicine that is taken by mouth (orally).  Medicine that is applied as a cream (topically).  Medicine that is inserted directly into the vagina (suppository). Follow these instructions at home:  Lifestyle  Do not have sex until your health care provider approves. Tell your sex partner that you have a yeast infection. That person should go to his or her health care  provider and ask if they should also be treated.  Do not wear tight clothes, such as pantyhose or tight pants.  Wear breathable cotton underwear. General instructions  Take or apply over-the-counter and prescription medicines only as told by your health care provider.  Eat more yogurt. This may help to keep your yeast infection from returning.  Do not use tampons until your health care provider approves.  Try taking a sitz bath to help with discomfort. This is a warm water bath that is taken while you are sitting down. The water should only come up to your hips and should cover your buttocks. Do this 3-4 times per day or as told by your health care provider.  Do not douche.  If you have diabetes, keep your blood sugar levels under control.  Keep all follow-up visits as told by your health care provider. This is important. Contact a health care provider if:  You have a fever.  Your symptoms go away and then return.  Your symptoms do not get better with treatment.  Your symptoms get worse.  You have new symptoms.  You develop blisters in or around your vagina.  You have blood coming from your vagina and it is not your menstrual period.  You develop pain in your abdomen. Summary  Vaginal yeast infection is a condition that causes discharge as well as soreness, swelling, and redness (inflammation) of the vagina.  This condition is treated with medicine. Medicines may be over-the-counter or prescription.  Take or apply over-the-counter and prescription medicines only as told by your health care provider.  Do not douche.   Do not have sex or use tampons until your health care provider approves.  Contact a health care provider if your symptoms do not get better with treatment or your symptoms go away and then return. This information is not intended to replace advice given to you by your health care provider. Make sure you discuss any questions you have with your health care  provider. Document Revised: 02/13/2019 Document Reviewed: 12/02/2017 Elsevier Patient Education  2020 [employer].  Please use a daily oral probiotic

## 2019-09-03 ENCOUNTER — Other Ambulatory Visit: Payer: Self-pay

## 2019-09-03 ENCOUNTER — Ambulatory Visit (INDEPENDENT_AMBULATORY_CARE_PROVIDER_SITE_OTHER): Payer: PRIVATE HEALTH INSURANCE | Admitting: Family Medicine

## 2019-09-03 VITALS — BP 98/62 | HR 56 | Ht 61.0 in | Wt 149.5 lb

## 2019-09-03 DIAGNOSIS — L853 Xerosis cutis: Secondary | ICD-10-CM | POA: Diagnosis not present

## 2019-09-03 MED ORDER — TRIAMCINOLONE ACETONIDE 0.1 % EX CREA: 1.0000 "application " | TOPICAL_CREAM | Freq: Two times a day (BID) | CUTANEOUS | 0 refills | Status: DC

## 2019-09-03 NOTE — Progress Notes (Signed)
   CHIEF COMPLAINT / HPI:  Dry skin Patient reports that for the past few weeks she has had significant dry skin on her body.  She states that it started on her neck and hands but has since spread to her arms and now her ears are itching.  She denies any rash associated.  She states that she regularly has dry skin but this is gets gotten much worse.  She states that every morning she wakes up and uses Vaseline as well as baby oils and she is also started using some eczema lotion but has not had any relief.  She states that when her neck was itching and dry she used hydrocortisone and alcohol in the area which seemed to help but it has since returned.  She is coming in today because once her ears became itchy and dry she wanted some relief. -Patient denies any fevers, chills, new medications  PERTINENT  PMH / PSH: No significant past medical history   OBJECTIVE: BP 98/62   Pulse (!) 56   Ht 5\' 1"  (1.549 m)   Wt 149 lb 8 oz (67.8 kg)   LMP 08/15/2019   SpO2 99%   BMI 28.25 kg/m   General: NAD, well appearing Skin: Dry patches noted on dorsum of bilateral hands with also dry skin noted on flexor surfaces of bilateral arms   ASSESSMENT / PLAN:  Dry skin Patient with no personal history of eczema.  Dry skin noted on exam likely related to weather changes given that she has no new medications.  No red flag symptoms.  Patient reassured.  Instructed to continue using Vaseline and may increase the frequency.  Also given a triamcinolone 0.1% steroid cream in order to mix with the Vaseline or other creams.  Instructed patient to avoid alcohol as this will dry out her skin more.  Patient also advised to use creams or ointments rather than lotions.  Counseled her on decreasing the amount of hot showers that she is taking and to apply the the moisturizing products after showers.  Patient to return if symptoms do not improve or resolve within the next few weeks.     08/17/2019 Larry Alcock, DO [facility]  [facility]

## 2019-09-03 NOTE — Patient Instructions (Signed)
Thank you for coming to see me today. It was a pleasure! Today we talked about:   For your dry skin as we discussed, I recommend that you use the steroid cream twice daily for 7 days to see if this helps.  I recommend using this with the Vaseline that you are currently using.  Try to use this more frequently especially after your warm showers.  You may use the steroid with the creams that you are using.  I would avoid lotions they are water-based and can dry your skin out.  If your dry skin does not improve or worsens and do not hesitate to come back to the office.  Please follow-up as needed.  If you have any questions or concerns, please do not hesitate to call the office at [phone].  Take Care,   Martin[place] Harmonii Karle, DO  Atopic Dermatitis Atopic dermatitis is a skin disorder that causes inflammation of the skin. This is the most common type of eczema. Eczema is a group of skin conditions that cause the skin to be itchy, red, and swollen. This condition is generally worse during the cooler winter months and often improves during the warm summer months. Symptoms can vary from person to person. Atopic dermatitis usually starts showing signs in infancy and can last through adulthood. This condition cannot be passed from one person to another (non-contagious), but it is more common in families. Atopic dermatitis may not always be present. When it is present, it is called a flare-up. What are the causes? The exact cause of this condition is not known. Flare-ups of the condition may be triggered by:  Contact with something that you are sensitive or allergic to.  Stress.  Certain foods.  Extremely hot or cold weather.  Harsh chemicals and soaps.  Dry air.  Chlorine. What increases the risk? This condition is more likely to develop in people who have a personal history or family history of eczema, allergies, asthma, or hay fever. What are the signs or symptoms? Symptoms of this  condition include:  Dry, scaly skin.  Red, itchy rash.  Itchiness, which can be severe. This may occur before the skin rash. This can make sleeping difficult.  Skin thickening and cracking that can occur over time. How is this diagnosed? This condition is diagnosed based on your symptoms, a medical history, and a physical exam. How is this treated? There is no cure for this condition, but symptoms can usually be controlled. Treatment focuses on:  Controlling the itchiness and scratching. You may be given medicines, such as antihistamines or steroid creams.  Limiting exposure to things that you are sensitive or allergic to (allergens).  Recognizing situations that cause stress and developing a plan to manage stress. If your atopic dermatitis does not get better with medicines, or if it is all over your body (widespread), a treatment using a specific type of light (phototherapy) may be used. Follow these instructions at home: Skin care   Keep your skin well-moisturized. Doing this seals in moisture and helps to prevent dryness. ? Use unscented lotions that have petroleum in them. ? Avoid lotions that contain alcohol or water. They can dry the skin.  Keep baths or showers short (less than 5 minutes) in warm water. Do not use hot water. ? Use mild, unscented cleansers for bathing. Avoid soap and bubble bath. ? Apply a moisturizer to your skin right after a bath or shower.  Do not apply anything to your skin without checking with  your health care provider. General instructions  Dress in clothes made of cotton or cotton blends. Dress lightly because heat increases itchiness.  When washing your clothes, rinse your clothes twice so all of the soap is removed.  Avoid any triggers that can cause a flare-up.  Try to manage your stress.  Keep your fingernails cut short.  Avoid scratching. Scratching makes the rash and itchiness worse. It may also result in a skin infection (impetigo)  due to a break in the skin caused by scratching.  Take or apply over-the-counter and prescription medicines only as told by your health care provider.  Keep all follow-up visits as told by your health care provider. This is important.  Do not be around people who have cold sores or fever blisters. If you get the infection, it may cause your atopic dermatitis to worsen. Contact a health care provider if:  Your itchiness interferes with sleep.  Your rash gets worse or it is not better within one week of starting treatment.  You have a fever.  You have a rash flare-up after having contact with someone who has cold sores or fever blisters. Get help right away if:  You develop pus or soft yellow scabs in the rash area. Summary  This condition causes a red rash and itchy, dry, scaly skin.  Treatment focuses on controlling the itchiness and scratching, limiting exposure to things that you are sensitive or allergic to (allergens), recognizing situations that cause stress, and developing a plan to manage stress.  Keep your skin well-moisturized.  Keep baths or showers shorter than 5 minutes and use warm water. Do not use hot water. This information is not intended to replace advice given to you by your health care provider. Make sure you discuss any questions you have with your health care provider. Document Revised: 11/04/2018 Document Reviewed: 08/17/2016 Elsevier Patient Education  2020 [employer].

## 2019-09-05 DIAGNOSIS — L853 Xerosis cutis: Secondary | ICD-10-CM | POA: Insufficient documentation

## 2019-09-05 NOTE — Assessment & Plan Note (Signed)
Patient with no personal history of eczema.  Dry skin noted on exam likely related to weather changes given that she has no new medications.  No red flag symptoms.  Patient reassured.  Instructed to continue using Vaseline and may increase the frequency.  Also given a triamcinolone 0.1% steroid cream in order to mix with the Vaseline or other creams.  Instructed patient to avoid alcohol as this will dry out her skin more.  Patient also advised to use creams or ointments rather than lotions.  Counseled her on decreasing the amount of hot showers that she is taking and to apply the the moisturizing products after showers.  Patient to return if symptoms do not improve or resolve within the next few weeks.

## 2019-12-30 ENCOUNTER — Other Ambulatory Visit: Payer: Self-pay

## 2019-12-30 ENCOUNTER — Encounter: Payer: Self-pay | Admitting: Family Medicine

## 2019-12-30 ENCOUNTER — Ambulatory Visit (INDEPENDENT_AMBULATORY_CARE_PROVIDER_SITE_OTHER): Payer: PRIVATE HEALTH INSURANCE | Admitting: Family Medicine

## 2019-12-30 VITALS — BP 102/62 | HR 100 | Ht 61.0 in | Wt 150.4 lb

## 2019-12-30 DIAGNOSIS — N6315 Unspecified lump in the right breast, overlapping quadrants: Secondary | ICD-10-CM

## 2019-12-30 NOTE — Patient Instructions (Addendum)
Was great seeing you again today!  I am sorry that your breast nodule seems to have gotten a little bigger.  The best way to evaluate this is with a mammogram.  I put the order in the computer, you can call the number on this card and schedule the appointment.

## 2019-12-31 ENCOUNTER — Other Ambulatory Visit: Payer: Self-pay | Admitting: Family Medicine

## 2019-12-31 DIAGNOSIS — N6315 Unspecified lump in the right breast, overlapping quadrants: Secondary | ICD-10-CM

## 2019-12-31 NOTE — Assessment & Plan Note (Signed)
We will proceed with repeat imaging.  Bilateral mammograms ordered given the perceived increase in size and slight increase in pain.

## 2019-12-31 NOTE — Progress Notes (Signed)
° °  CHIEF COMPLAINT / HPI: 34 year old female who presents with breast nodule.  Patient states that she felt a nodule ranging from the 9 o'clock position to the 12 o'clock position of her right breast recently.  She had a similar nodule in 01/19/2019 which never resolved.  She had multiple anechoic cystic structures in the right breast.  Recommendation was to reimage if the nodule changed characteristics or size.  Patient has smaller nodules located around the 4 and 6 position of her left breast as well.  The nodules in the right breast have become a little more painful recently.  The patient has a significant family history of breast cancer.  PERTINENT  PMH / PSH: None   OBJECTIVE: BP 102/62    Pulse 100    Ht 5\' 1"  (1.549 m)    Wt 150 lb 6.4 oz (68.2 kg)    SpO2 96%    BMI 28.42 kg/m   Gen: 33 year old African-American female, no acute distress Resp: no accessory muscle use Right breast: Freely mobile, cystic structure palpated from the 9 o'clock position to 12 clock position right breast.  Nonpainful to touch. Left breast: Palpable cystic structure, smaller in size than the right breast structure, at the 4 and 6 position Neuro: Alert and oriented, Speech clear, No gross deficits   ASSESSMENT / PLAN:  Breast lump on right side at 12 o'clock position We will proceed with repeat imaging.  Bilateral mammograms ordered given the perceived increase in size and slight increase in pain.    35, MD [facility]

## 2020-01-10 ENCOUNTER — Telehealth: Payer: PRIVATE HEALTH INSURANCE | Admitting: Physician Assistant

## 2020-01-10 DIAGNOSIS — R399 Unspecified symptoms and signs involving the genitourinary system: Secondary | ICD-10-CM

## 2020-01-10 MED ORDER — CEPHALEXIN 500 MG PO CAPS: 500.0000 mg | ORAL_CAPSULE | Freq: Two times a day (BID) | ORAL | 0 refills | Status: AC

## 2020-01-10 NOTE — Progress Notes (Signed)

## 2020-01-20 ENCOUNTER — Other Ambulatory Visit: Payer: PRIVATE HEALTH INSURANCE

## 2020-01-20 ENCOUNTER — Ambulatory Visit
Admission: RE | Admit: 2020-01-20 | Discharge: 2020-01-20 | Disposition: A | Discharge status: Home / Self Care | Payer: PRIVATE HEALTH INSURANCE | Source: Ambulatory Visit | Attending: Family Medicine | Admitting: Family Medicine

## 2020-01-20 ENCOUNTER — Other Ambulatory Visit: Payer: Self-pay

## 2020-01-20 DIAGNOSIS — N6315 Unspecified lump in the right breast, overlapping quadrants: Secondary | ICD-10-CM

## 2020-03-03 ENCOUNTER — Other Ambulatory Visit: Payer: Self-pay

## 2020-03-03 ENCOUNTER — Encounter: Payer: Self-pay | Admitting: Family Medicine

## 2020-03-03 ENCOUNTER — Ambulatory Visit (INDEPENDENT_AMBULATORY_CARE_PROVIDER_SITE_OTHER): Payer: PRIVATE HEALTH INSURANCE | Admitting: Family Medicine

## 2020-03-03 VITALS — BP 102/66 | HR 82 | Ht 61.0 in | Wt 149.0 lb

## 2020-03-03 DIAGNOSIS — Z0000 Encounter for general adult medical examination without abnormal findings: Secondary | ICD-10-CM | POA: Diagnosis not present

## 2020-03-03 DIAGNOSIS — Z1159 Encounter for screening for other viral diseases: Secondary | ICD-10-CM | POA: Diagnosis not present

## 2020-03-03 NOTE — Progress Notes (Signed)
    SUBJECTIVE:   CHIEF COMPLAINT / HPI: Annual physical  Ariely is a 34 year old female presenting for her annual physical.  She has no concerns today, doing well.  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  GYN: G4 [id number].  Cycles usually monthly, sometimes heavy. Contraception: Essure tubal sterilization in 2015 Pap smears: Up-to-date, last 02/06/2018 normal with negative HPV Family history: Denies any family history of colon cancer, breast cancer, early CAD, or sudden death. Screen for STDs: Denies need for this today.  Health maintenance: Due for hepatitis C screening, received her Covid vaccine in April/May    Office Visit from 03/03/2020 in [facility] Office Visit from 05/26/2018 in [facility]  Thoughts that you would be better off dead, or of hurting yourself in some way Not at all Not at all  PHQ-9 Total Score 0 0      PERTINENT  PMH / PSH: Fibrocystic breast changes, abnormal uterine bleeding  OBJECTIVE:   BP 102/66   Pulse 82   Ht 5\' 1"  (1.549 m)   Wt 149 lb (67.6 kg)   LMP 02/13/2020   SpO2 98%   BMI 28.15 kg/m   General: Alert, NAD HEENT: NCAT, MMM Cardiac: RRR Lungs: Clear bilaterally, no increased WOB  Abdomen: soft Msk: Moves all extremities spontaneously, normal gait  Ext: Warm, dry, 2+ distal pulses, no edema  Psych: Pleasant/cheerful mood and affect  ASSESSMENT/PLAN:   Annual physical exam Reviewed past medical, surgical, and social history.  Reviewed recent labs in 02/2019 with no necessity for follow-up.  Medications updated in  epic.  Health maintenance reviewed and will obtain hepatitis C screening. Encouraged working towards a well-balanced diet with meal prepping and staying physically active.     Follow-up annually or sooner if any concerns.  03/2019, DO [facility]

## 2020-03-03 NOTE — Assessment & Plan Note (Addendum)
Reviewed past medical, surgical, and social history.  Reviewed recent labs in 02/2019 with no necessity for follow-up.  Medications updated in epic.  Health maintenance reviewed and will obtain hepatitis C screening. Encouraged working towards a well-balanced diet with meal prepping and staying physically active.

## 2020-03-03 NOTE — Patient Instructions (Signed)
Wonderful to see you today!   We discussed trying to work on [employer] in between shifts. Keep up the good work with staying active!

## 2020-03-04 LAB — HEPATITIS C ANTIBODY: Hep C Virus Ab: <0.1 s/co ratio (ref 0.0–0.9)

## 2020-06-01 ENCOUNTER — Telehealth: Payer: Self-pay

## 2020-06-01 NOTE — Telephone Encounter (Signed)
Patient calls nurse line regarding visual changes over the last few days. Patient reports that right eye has been "jumping and twitching". Patient also states that she had a recent episode of very blurry vision that has since resolved. Denies history of high blood pressure, floaters or spots in visual field.   As we do not have any appointments until Monday, advised patient to contact eye doctor for further management/ possible appointment.   To PCP  Veronda Prude, RN

## 2020-12-29 IMAGING — MG DIGITAL DIAGNOSTIC BILAT W/ TOMO W/ CAD
6 of 10 series · 6 of 30 positions shown · non-contrast
Comparison: Previous exam(s).

CLINICAL DATA: 33-year-old female with an enlarging right breast
palpable lump.

EXAM:
DIGITAL DIAGNOSTIC BILATERAL MAMMOGRAM WITH CAD AND TOMO
ULTRASOUND RIGHT BREAST

[R TAN synth-2D]
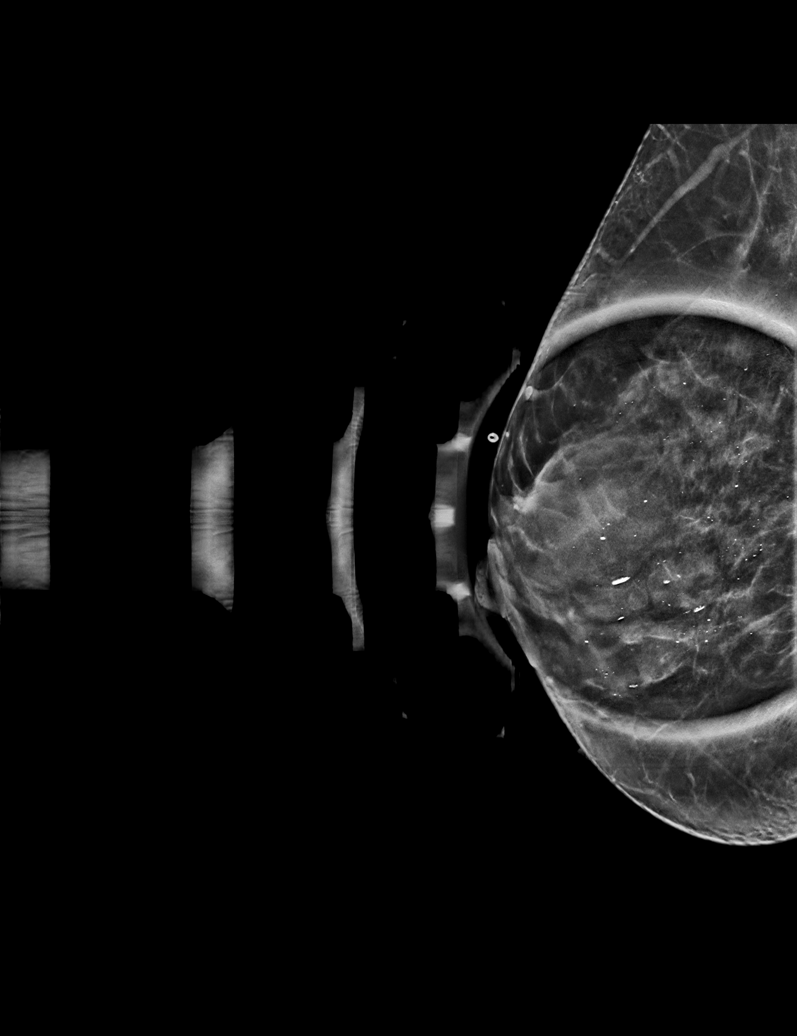

[R CC synth-2D]
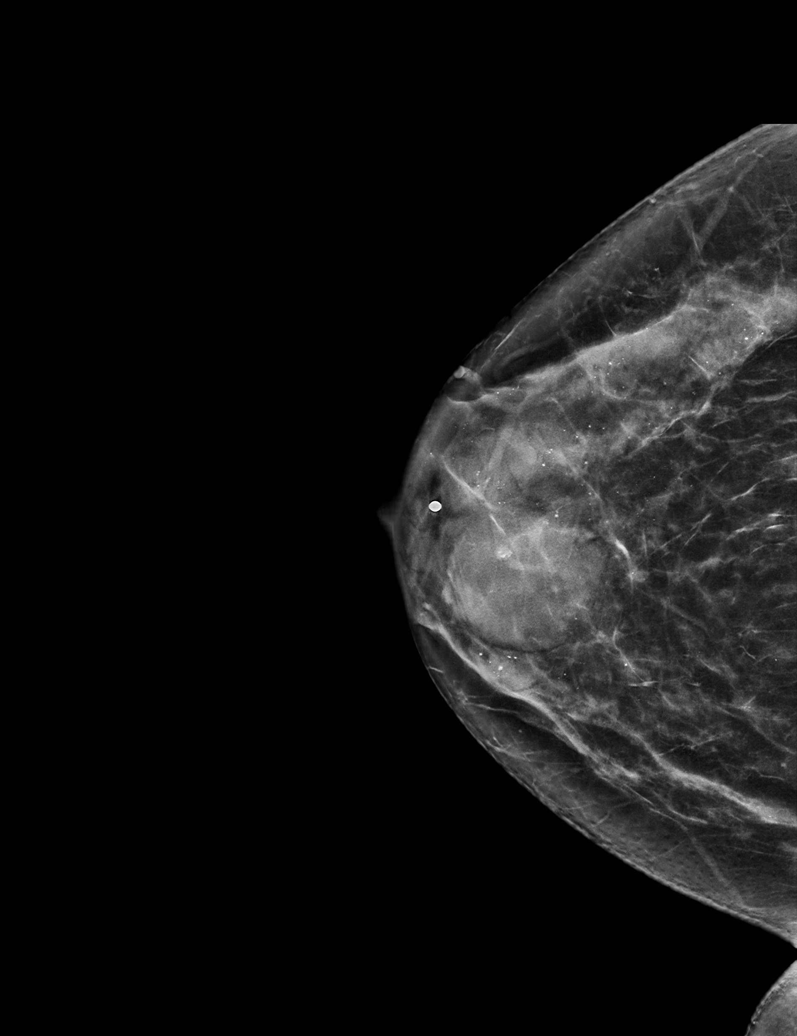

[R MLO synth-2D]
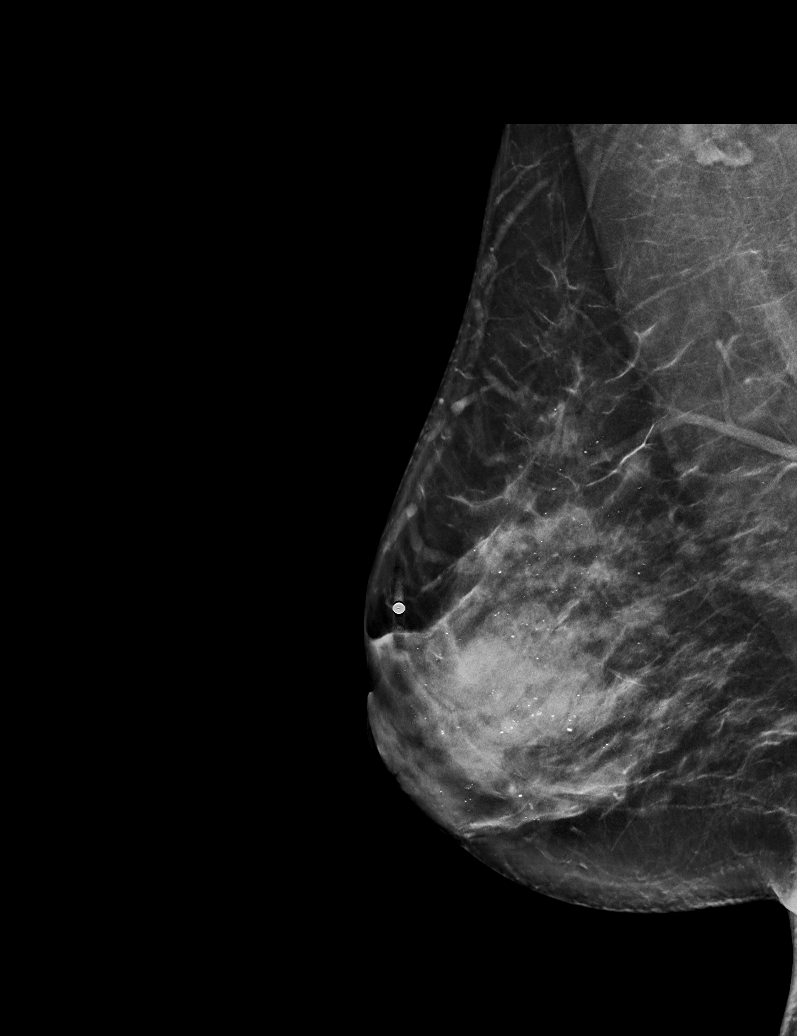

[L MLO synth-2D]
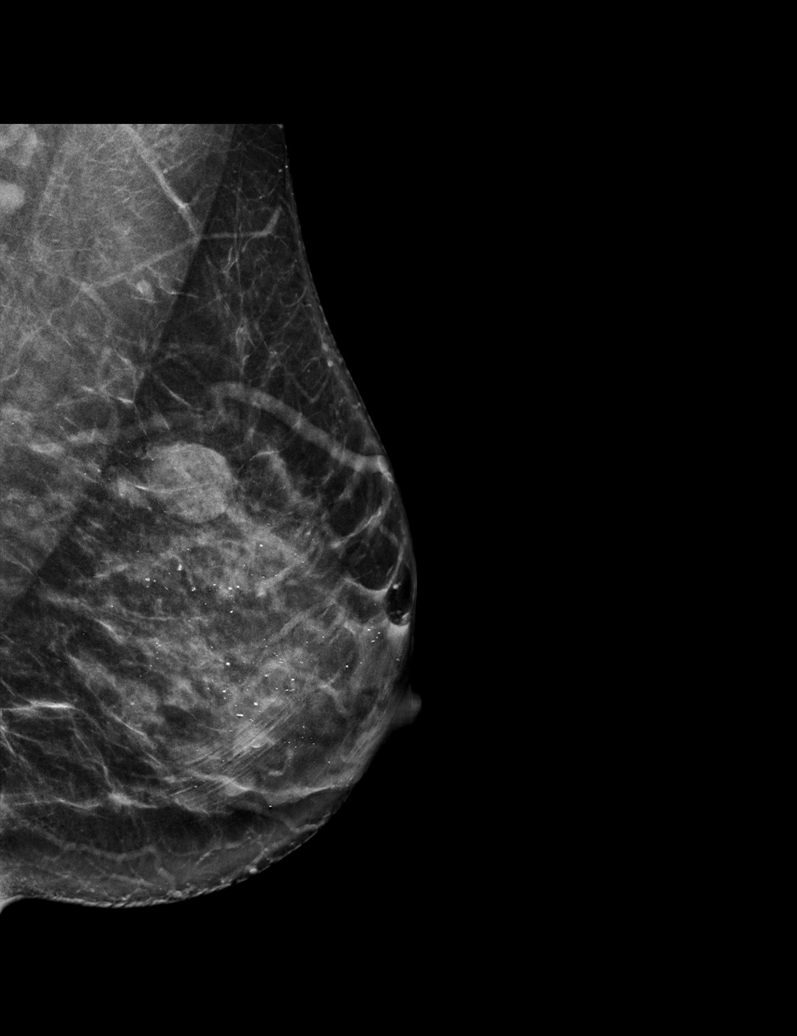

[L CC synth-2D]
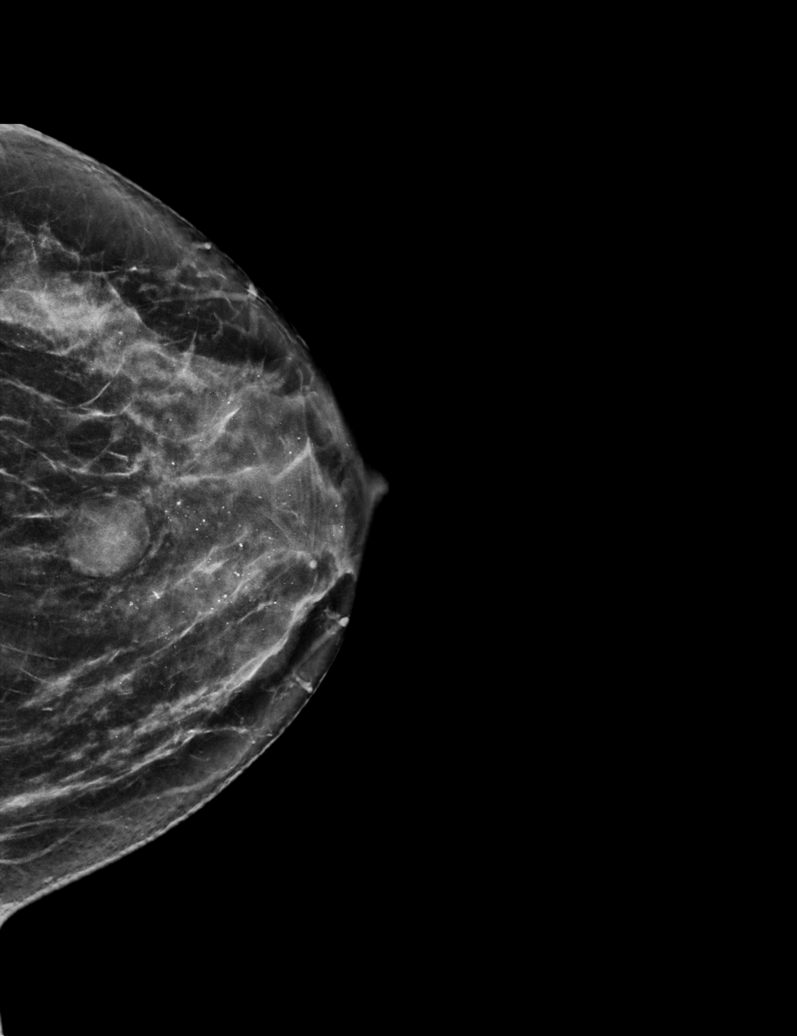

[L CC tomo · tomo slice 31/61.0]
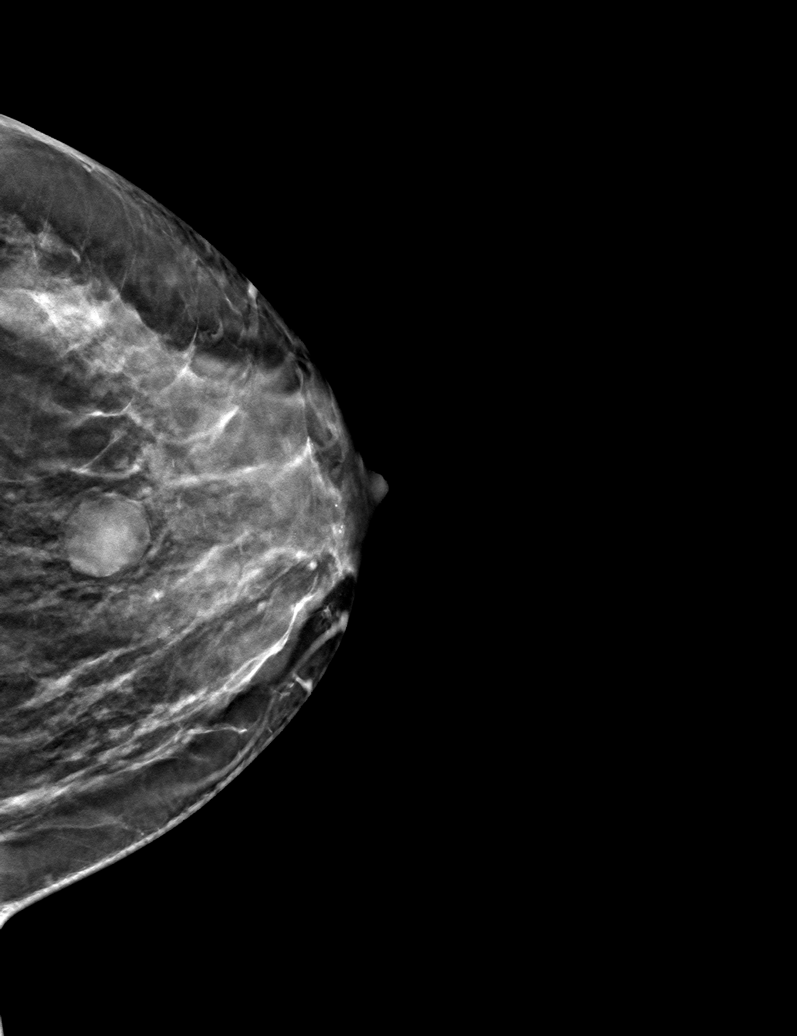

[6 of 30 positions shown; findings below may reference images not displayed]

ACR Breast Density Category c: The breast tissue is heterogeneously
dense, which may obscure small masses.
FINDINGS: Waxing and waning circumscribed equal density masses in the
bilateral breasts are consistent with a benign changing cystic
pattern. A radiopaque BB in the periareolar right breast is seen in
association with an enlarging cyst. Confirmatory ultrasound was
performed.

No additional suspicious findings are identified in either breast.

Mammographic images were processed with CAD.

Targeted ultrasound is performed, showing an oval, circumscribed
anechoic mass at the 12 o'clock position 1 cm from the nipple. It
measures 3.1 x 3.1 x 1.8 cm (previously 2.4 x 1.9 x 1.2 cm). There
is no internal vascularity. This is consistent with a benign simple
cyst.
IMPRESSION: 1. Benign right breast simple cyst corresponding with the patient's
palpable lump. No further imaging follow-up required.
2. No mammographic evidence of malignancy in the remainder of either
breast.

RECOMMENDATION:
Screening mammogram at age 41 unless there are persistent or
intervening clinical concerns. (Code:[id number]-5-2K3)

I have discussed the findings and recommendations with the patient.
If applicable, a reminder letter will be sent to the patient
regarding the next appointment.

BI-RADS CATEGORY  2: Benign.

## 2021-02-23 NOTE — Progress Notes (Signed)
    SUBJECTIVE:   CHIEF COMPLAINT / HPI:   Employment Physical Ms. Sammon reports that she is quite invested in protecting her health.  Does not take any chronic medications.  Diet: Varied diet, no pork.  Exercise: Walking regularly for both fun and exercise.  Smoking/illicit drug use: None. Alcohol: None. Relationships: Says she has a wonderful relationship with husband and 4 children in the home.  Reports good relationships and friendships within her church.  Is socially active. 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   Cycles: Irregular. Spotty for 2-3 days, then has a weeklong, heavy flow. Hx of fibroids.  Contraception: Had tubal ligation in 2015 Pap smears: Last in 01/2018 which was normal with negative HPV, due today Screening for STDs:   Hep B Titer She is required to show evidence of immunity to Hep B for work. There is no record of previous vaccination but she believes that she has previously been vaccinated.  Breast Lumps She has a history of fibrocystic breast changes confirmed on diagnostic mammogram in June 2021.  She regularly performs breast self exams at home and has noticed that some cysts previously identified may feel larger now.  She does report that they seem to become larger and somewhat painful during her periods.  She is concerned because she has a family history of breast cancer in her maternal grandmother.  She is unsure exactly what age she developed the cancer but states that she was young.  PERTINENT  PMH / PSH: Fibrocystic breast changes, tubal ligation in 2015  OBJECTIVE:   BP (!) 104/59   Pulse (!) 56   Ht 5\' 1"  (1.549 m)   Wt 147 lb 9.6 oz (67 kg)   LMP 02/06/2021   SpO2 100%   BMI 27.89 kg/m    General: Well-appearing, interactive young female, appears stated age Eyes: Pupils equal round and reactive to light HEENT: Neck supple without lymphadenopathy, Thyroid normal.   Mucous membranes moist, uvula midline. Cardio: Regular rate, regular rhythm, no murmur, rub, gallop Pulm: Normal work of breathing on room air, lung fields clear to auscultation throughout.  Breast: Bilateral breast without overlying skin changes, nipples without inversion or discharge, fibrocystic changes throughout, most noticeable cyst on right breast in the 12 o'clock position, nontender to palpation Abdomen: Soft, nontender, nondistended GU: Vulva normal, cervix with scant white discharge, without cervical motion, uterine or adnexal tenderness Extremities: Without swelling or deformity   ASSESSMENT/PLAN:   Fibrocystic breast changes of both breasts Breast exam today consistent with fibrocystic breast changes previously noted on diagnostic mammogram in June 2021.  Discussed this with the patient, reassured her that these are benign, normal findings.  However given her family history of breast cancer in a young age in her maternal grandmother, would recommend starting breast cancer screening mammograms at 35 rather than 39. -Initiate screening mammograms at age 35  Annual physical exam Medical, surgical, social history reviewed.  Health maintenance reviewed and patient was due for Pap.   -Congratulated on her overall attention to her health.   -Pap smear completed in clinic today.     42, MD [facility]

## 2021-02-24 ENCOUNTER — Ambulatory Visit (INDEPENDENT_AMBULATORY_CARE_PROVIDER_SITE_OTHER): Payer: PRIVATE HEALTH INSURANCE | Admitting: Student

## 2021-02-24 ENCOUNTER — Other Ambulatory Visit (HOSPITAL_COMMUNITY)
Admission: RE | Admit: 2021-02-24 | Discharge: 2021-02-24 | Disposition: A | Discharge status: Home / Self Care | Payer: PRIVATE HEALTH INSURANCE | Source: Ambulatory Visit | Attending: Family Medicine | Admitting: Family Medicine

## 2021-02-24 ENCOUNTER — Encounter: Payer: Self-pay | Admitting: Student

## 2021-02-24 ENCOUNTER — Other Ambulatory Visit: Payer: Self-pay

## 2021-02-24 VITALS — BP 104/59 | HR 56 | Ht 61.0 in | Wt 147.6 lb

## 2021-02-24 DIAGNOSIS — N6012 Diffuse cystic mastopathy of left breast: Secondary | ICD-10-CM

## 2021-02-24 DIAGNOSIS — Z789 Other specified health status: Secondary | ICD-10-CM | POA: Diagnosis not present

## 2021-02-24 DIAGNOSIS — N6011 Diffuse cystic mastopathy of right breast: Secondary | ICD-10-CM

## 2021-02-24 DIAGNOSIS — Z0000 Encounter for general adult medical examination without abnormal findings: Secondary | ICD-10-CM | POA: Diagnosis present

## 2021-02-24 NOTE — Assessment & Plan Note (Addendum)
Breast exam today consistent with fibrocystic breast changes previously noted on diagnostic mammogram in June 2021.  Discussed this with the patient, reassured her that these are benign, normal findings.  However given her family history of breast cancer in a young age in her maternal grandmother, would recommend starting breast cancer screening mammograms at 89 rather than 43. -Initiate screening mammograms at age 36

## 2021-02-24 NOTE — Patient Instructions (Addendum)
Ms. Andringa, It is such a joy to take care you! Thank you for coming in today.   As a reminder, here is a recap of what we talked about today:  -You are doing an excellent job of caring for yourself and doing what is best for your health. -We did a Pap smear today.  I will be in touch with the results of that -We did a breast exam to evaluate the fibrocystic changes that you have been feeling.  Your breast exam today is normal.  Continue to self monitor and let us know if anything changes. -I would recommend that you get a COVID booster shot  We are checking some labs today. I will call you if they are abnormal. I will send you a MyChart message or a letter if they are normal.  If you do not hear about your labs in the next 2 weeks please let us know.  I will see you next year! If anything comes up, please let us know and we are always happy to see you!   Take care and seek immediate care sooner if you develop any concerns.   Eliezer Mccoy, MD [facility]

## 2021-02-24 NOTE — Assessment & Plan Note (Addendum)
Medical, surgical, social history reviewed.  Health maintenance reviewed and patient was due for Pap.   -Congratulated on her overall attention to her health.   -Pap smear completed in clinic today.

## 2021-02-25 LAB — HEPATITIS B SURFACE ANTIBODY, QUANTITATIVE: Hepatitis B Surf Ab Quant: 432.8 m[IU]/mL (ref >9.9)

## 2021-02-27 LAB — CYTOLOGY - PAP
Comment: NEGATIVE
Diagnosis: NEGATIVE
Diagnosis: REACTIVE
High risk HPV: NEGATIVE

## 2021-03-30 ENCOUNTER — Encounter: Payer: PRIVATE HEALTH INSURANCE | Admitting: Student

## 2021-06-19 ENCOUNTER — Encounter: Payer: Self-pay | Admitting: Family Medicine

## 2021-06-19 ENCOUNTER — Ambulatory Visit (INDEPENDENT_AMBULATORY_CARE_PROVIDER_SITE_OTHER): Payer: PRIVATE HEALTH INSURANCE | Admitting: Family Medicine

## 2021-06-19 ENCOUNTER — Other Ambulatory Visit: Payer: Self-pay | Admitting: Family Medicine

## 2021-06-19 ENCOUNTER — Other Ambulatory Visit: Payer: Self-pay

## 2021-06-19 VITALS — BP 98/60 | HR 80 | Wt 150.0 lb

## 2021-06-19 DIAGNOSIS — A630 Anogenital (venereal) warts: Secondary | ICD-10-CM | POA: Diagnosis not present

## 2021-06-19 MED ORDER — IMIQUIMOD 3.75 % EX CREA: 1.0000 "application " | TOPICAL_CREAM | Freq: Every day | CUTANEOUS | 0 refills | Status: DC

## 2021-06-19 NOTE — Patient Instructions (Addendum)
It was nice seeing you today!  Apply Zyclara to the wart once a day before bed for up to 8 weeks. Wash it off with soap and water in the morning. Avoid sexual contact while the cream is on. It may cause some irritation.  Stay well, Littie Deeds, MD [facility] [phone]

## 2021-06-19 NOTE — Progress Notes (Signed)
    SUBJECTIVE:   CHIEF COMPLAINT / HPI:   Patient reports concern for genital wart.  She reports husband was treated for penile warts several months ago.  No other symptoms including vaginal discharge, itching, pain.  No concern for STI.  The area does not bother her but she does want it removed.  PERTINENT  PMH / PSH: Noncontributory  OBJECTIVE:   Wt 150 lb (68 kg)   BMI 28.34 kg/m   General: Overweight young female, NAD GU: Chaperone present.  Approximately 0.5 cm fleshy growth on the inferior right labia majora.  ASSESSMENT/PLAN:   Condyloma acuminatum in female  Not bothersome but patient prefers treatment. - imiquimod 3.75% cream daily x 8 weeks  Littie Deeds, MD [facility]

## 2021-06-21 ENCOUNTER — Telehealth: Payer: Self-pay | Admitting: Student

## 2021-06-21 NOTE — Telephone Encounter (Signed)
Received message from pt's pharmacy that cost of imiquimod would be >$1000. GoodRx coupon available for ~$300. Called patient to discuss management options, she prefers not to pursue cryotherapy at this time and will manage lesion expectantly for now.  Dorothyann Gibbs, MD

## 2021-12-20 ENCOUNTER — Encounter: Payer: Self-pay | Admitting: Student

## 2022-01-02 ENCOUNTER — Encounter: Payer: Self-pay | Admitting: *Deleted

## 2022-01-15 ENCOUNTER — Encounter: Payer: Self-pay | Admitting: Student

## 2022-01-15 ENCOUNTER — Ambulatory Visit (INDEPENDENT_AMBULATORY_CARE_PROVIDER_SITE_OTHER): Payer: PRIVATE HEALTH INSURANCE | Admitting: Student

## 2022-01-15 ENCOUNTER — Other Ambulatory Visit: Payer: Self-pay | Admitting: Family Medicine

## 2022-01-15 VITALS — BP 127/68 | HR 77 | Wt 149.4 lb

## 2022-01-15 DIAGNOSIS — N6011 Diffuse cystic mastopathy of right breast: Secondary | ICD-10-CM

## 2022-01-15 DIAGNOSIS — N6315 Unspecified lump in the right breast, overlapping quadrants: Secondary | ICD-10-CM | POA: Diagnosis not present

## 2022-01-15 DIAGNOSIS — N6012 Diffuse cystic mastopathy of left breast: Secondary | ICD-10-CM

## 2022-01-15 DIAGNOSIS — N898 Other specified noninflammatory disorders of vagina: Secondary | ICD-10-CM | POA: Insufficient documentation

## 2022-01-15 DIAGNOSIS — Z0000 Encounter for general adult medical examination without abnormal findings: Secondary | ICD-10-CM | POA: Diagnosis not present

## 2022-01-15 DIAGNOSIS — M12812 Other specific arthropathies, not elsewhere classified, left shoulder: Secondary | ICD-10-CM

## 2022-01-15 LAB — POCT GLYCOSYLATED HEMOGLOBIN (HGB A1C): Hemoglobin A1C: 5.7 % — AB (ref 4.0–5.6)

## 2022-01-15 LAB — POCT WET PREP (WET MOUNT)
Clue Cells Wet Prep Whiff POC: NEGATIVE
Trichomonas Wet Prep HPF POC: ABSENT

## 2022-01-15 MED ORDER — FLUCONAZOLE 150 MG PO TABS: 150.0000 mg | ORAL_TABLET | Freq: Once | ORAL | 0 refills | Status: AC

## 2022-01-15 NOTE — Assessment & Plan Note (Addendum)
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 Discussed pathophysiology and conservative management with patient. Active recovery management. Gave exercises for shoulder rehab to be done 2x daily for four weeks. Follow-up in 4-6 weeks.

## 2022-01-15 NOTE — Patient Instructions (Addendum)
Ms. Keltner,  I am so sorry to hear about the issues that you are having with your shoulder.  Based on my exam and your description of your symptoms, I think that you have a partial tear of one of the ligaments in your rotator cuff called the supraspinatus.  I am hopeful that this is something that we can get to heal with exercise and expectant management.  I am attaching some exercises, I would like you to do these twice daily for the next few weeks and come back to see me in 4 to 6 weeks to make sure that we are making progress.  If you are having some pain, you can always use ibuprofen or Tylenol.  Ice or heat may also be helpful. The lump in your right breast does feel a bit larger today than I remember feeling on your last exam.  I am therefore sending you for a another mammogram just to make sure that we are keeping a close eye on it.  Given your family history of breast cancer in early age, I am placing a referral to medical genetics, they will meet with you and discuss options for genetic testing to help determine your risk of also having breast cancer at an early age.  I will be eagerly awaiting the outcome of this conversation. Your wet prep did not show any yeast, however this is an imperfect test.  I have a high enough suspicion that this is caused by a yeast infection that we can go ahead and treat you with Diflucan 1 time.  If this does not help with the volume of discharge you are having, we can have a longer conversation next that you are here regarding the low hormones from your IUD and possible workarounds. We are getting some routine labs today, I will send you a MyChart message if these results are normal and will call you if they are abnormal and we need to make any changes.   Dorothyann Gibbs, MD

## 2022-01-15 NOTE — Assessment & Plan Note (Signed)
Wet prep without evidence of BC or yeast. In the absence of an obvious cause, will treat empirically for yeast and revisit at follow-up. If she remains with copious physiologic discharge at that time, may have to discuss the possibility that the local hormonal effect of her IUD is contributing to her discharge.

## 2022-01-15 NOTE — Assessment & Plan Note (Signed)
By my assessment, this mass is larger compared to my last breast exam on this patient seven months ago. Given this and her strong family history of breast CA, will obtain diagnostic mammogram. After discussion of genetic risk with the patient, will refer to genetics for possible genetic risk stratification given strong family history of breast CA at an early age.

## 2022-01-15 NOTE — Progress Notes (Cosign Needed Addendum)
SUBJECTIVE:   Chief compliant/HPI: annual examination  Erika Decker is a 36 y.o. who presents today for an annual exam. Her concerns today include L shoulder pain and concern for an enlarging breast mass.  Her maternal grandmother had breast cancer at age 80 and she performs regular breast exams.  She feels that the lump previously noted on the right breast at about the 12 o'clock position has been getting larger over time.  It is not particularly painful and does not seem to change in size correlated with her periods.  She has had this breast mass ultrasound in in the past as recently as 2021.  At that time it was noted to have enlarged in size but radiographic characteristics were consistent with benign simple cyst.  Her shoulder has been bothering her for some weeks now.  She notices pain mostly with above the head activities.  She exercises regularly with a group from her church.  Denies any acute trauma or injury to the joint.  She is still able to complete her day-to-day activities but finds that movements, especially overhead movements are painful.  Review of systems form notable for copious vaginal discharge.    OBJECTIVE:   BP 127/68   Pulse 77   Wt 149 lb 6.4 oz (67.8 kg)   SpO2 97%   BMI 28.23 kg/m   Physical Exam Vitals reviewed. Exam conducted with a chaperone present.  Constitutional:      General: She is not in acute distress. Chest:     Comments: Fibrocystic changes to bilateral breasts. Palpable mass, ~3x4cm at 12 o'clock position to the R breast, ~1cm above the areola. Mobile and non-tender with discrete borders. No overlying skin changes. Bilateral nipples without discharge or inversion.  Genitourinary:    Vagina: Vaginal discharge (white, small clumping) present. No lesions.     Cervix: Normal.  Musculoskeletal:     Right shoulder: No swelling, deformity, effusion or tenderness. Normal range of motion.     Left shoulder: Swelling, deformity, effusion and  tenderness (anteriorly, just below acromion) present.     Comments: Positive empty can test on left. Negative external rotation lag and lift off tests. Negative Hawkins, negative painful arc.       ASSESSMENT/PLAN:   Rotator cuff arthropathy of left shoulder Suspect partial-thickness supraspinatus injury. Discussed pathophysiology and conservative management with patient. Active recovery management. Gave exercises for shoulder rehab to be done 2x daily for four weeks. Follow-up in 4-6 weeks.   Vaginal discharge Wet prep without evidence of BC or yeast. In the absence of an obvious cause, will treat empirically for yeast and revisit at follow-up. If she remains with copious physiologic discharge at that time, may have to discuss the possibility that the local hormonal effect of her IUD is contributing to her discharge.   Breast lump on right side at 12 o'clock position By my assessment, this mass is larger compared to my last breast exam on this patient seven months ago. Given this and her strong family history of breast CA, will obtain diagnostic mammogram. After discussion of genetic risk with the patient, will refer to genetics for possible genetic risk stratification given strong family history of breast CA at an early age.     Annual Examination  See AVS for age appropriate recommendations.   PHQ score 0, reviewed and discussed. Blood pressure reviewed and at goal.  The patient currently uses IUD for contraception.   Considered the following items based upon USPSTF  recommendations: Lipid panel (nonfasting or fasting) discussed based upon AHA recommendations and ordered.  Consider repeat every 4-6 years.  Reviewed risk factors for latent tuberculosis and not indicated A1c for diabetes screening  Cervical cancer screening: prior Pap reviewed, repeat due in 2 year Immunizations none today    Dorothyann Gibbs, MD [facility]

## 2022-01-16 ENCOUNTER — Encounter: Payer: Self-pay | Admitting: Student

## 2022-01-16 ENCOUNTER — Telehealth: Payer: Self-pay | Admitting: Genetic Counselor

## 2022-01-16 LAB — LIPID PANEL
Chol/HDL Ratio: 4.4 ratio (ref 0.0–4.4)
Cholesterol, Total: 212 mg/dL — ABNORMAL HIGH (ref 100–199)
HDL: 48 mg/dL (ref >39)
LDL Chol Calc (NIH): 150 mg/dL — ABNORMAL HIGH (ref 0–99)
Triglycerides: 79 mg/dL (ref 0–149)
VLDL Cholesterol Cal: 14 mg/dL (ref 5–40)

## 2022-01-16 NOTE — Telephone Encounter (Signed)
Scheduled appt per 6/19 referral. Pt is aware of appt date and time. Pt is aware to arrive 15 mins prior to appt time and to bring and updated insurance card. Pt is aware of appt location.   

## 2022-02-01 ENCOUNTER — Encounter: Payer: Self-pay | Admitting: Family Medicine

## 2022-02-08 ENCOUNTER — Other Ambulatory Visit: Payer: Self-pay | Admitting: Student

## 2022-02-08 DIAGNOSIS — N6315 Unspecified lump in the right breast, overlapping quadrants: Secondary | ICD-10-CM

## 2022-02-12 ENCOUNTER — Ambulatory Visit (INDEPENDENT_AMBULATORY_CARE_PROVIDER_SITE_OTHER): Payer: PRIVATE HEALTH INSURANCE | Admitting: Student

## 2022-02-12 ENCOUNTER — Encounter: Payer: Self-pay | Admitting: Student

## 2022-02-12 VITALS — BP 113/80 | HR 78 | Wt 148.8 lb

## 2022-02-12 DIAGNOSIS — M12812 Other specific arthropathies, not elsewhere classified, left shoulder: Secondary | ICD-10-CM

## 2022-02-12 MED ORDER — METHYLPREDNISOLONE ACETATE 40 MG/ML IJ SUSP
40.0000 mg | Freq: Once | INTRAMUSCULAR | Status: AC
  Administered 2022-02-12: 40 mg via INTRAMUSCULAR

## 2022-02-12 NOTE — Patient Instructions (Signed)
Erika Decker,  I'm sorry that your shoulder is still bothering you despite you sticking to the exercises that I gave you.  We injected your shoulder with a solution of lidocaine and a steroid today.  I am hopeful that this will give you some relief and increase mobility around that joint.  If you find in a few weeks that you are still having troubles, come back to see [place] and we will likely refer you to our sports medicine colleagues at that point.  However, if you find that the steroid injection is helpful in getting you back to activity, we can consider repeating it at 3 months if you are still having trouble, though hopefully this will buy [place] some time for your shoulder to heal as you get back to full activity and strengthen the joint around it.  You can continue taking ibuprofen and Tylenol as needed.  Staying active is the very best thing that you can do!  Dorothyann Gibbs, MD

## 2022-02-12 NOTE — Progress Notes (Signed)
    SUBJECTIVE:   CHIEF COMPLAINT / HPI:   L Shoulder Pain Patient presents today for left shoulder pain. Seen in the office several weeks ago and thought to have likely partial-thickness supraspinatus tear. Given exercises which she has been doing with regularity. No improvement. Has been staying active with exercise groups through her church but has had to adapt most exercises due to pain. Many exercises can only be done with her R arm due to the pain. Has been taking PRN Tylenol with minimal improvement.  She is willing to try a steroid injection to the arm if we believe it may offer some relief.    OBJECTIVE:   BP 113/80   Pulse 78   Wt 148 lb 12.8 oz (67.5 kg)   LMP 01/23/2022   SpO2 100%   BMI 28.12 kg/m   L Shoulder: No deformity. Subacromial tenderness. Negative empty can, full can, Hawkins, painful arc. Negative external rotation lag and lift off tests. FROM throughout.  R Shoulder: Normal exam, FROM and no tenderness. Strength intact in all planes of motion  ASSESSMENT/PLAN:   Rotator cuff arthropathy of left shoulder Persistent pain despite several weeks of exercise rehab. Significantly impacting her participation in activities important to her. Exam today is actually improved. No swelling and negative empty can test. ?healing partial thickness supraspinatus tear versus other pathology such as subacromial bursitis. Patient open to injection. After informed consent was obtained, the patient's L shoulder was cleaned using alcohol and betadine. The joint capsule was then injected with 40mg  of DepoMedrol and 46mL lidocaine using an anterior approach. The patient tolerated the procedure well with immediate improvement in her pain symptoms.  Discussed post-injection care and expectations for pain relief.  - Follow-up as needed - If symptoms persist, will consider referral to sports medicine at next visit     1m, MD [facility]

## 2022-02-12 NOTE — Assessment & Plan Note (Signed)
Persistent pain despite several weeks of exercise rehab. Significantly impacting her participation in activities important to her. Exam today is actually improved. No swelling and negative empty can test. ?healing partial thickness supraspinatus tear versus other pathology such as subacromial bursitis. Patient open to injection. After informed consent was obtained, the patient's L shoulder was cleaned using alcohol and betadine. The joint capsule was then injected with 40mg  of DepoMedrol and 59mL lidocaine using an anterior approach. The patient tolerated the procedure well with immediate improvement in her pain symptoms.  Discussed post-injection care and expectations for pain relief.  - Follow-up as needed - If symptoms persist, will consider referral to sports medicine at next visit

## 2022-02-14 ENCOUNTER — Encounter: Payer: Self-pay | Admitting: Student

## 2022-02-14 DIAGNOSIS — M12812 Other specific arthropathies, not elsewhere classified, left shoulder: Secondary | ICD-10-CM

## 2022-02-16 ENCOUNTER — Other Ambulatory Visit: Payer: Self-pay | Admitting: Student

## 2022-02-16 DIAGNOSIS — M12812 Other specific arthropathies, not elsewhere classified, left shoulder: Secondary | ICD-10-CM

## 2022-02-21 ENCOUNTER — Encounter: Payer: Self-pay | Admitting: Student

## 2022-02-22 ENCOUNTER — Other Ambulatory Visit: Payer: Self-pay | Admitting: Genetic Counselor

## 2022-02-22 ENCOUNTER — Other Ambulatory Visit: Payer: Self-pay

## 2022-02-22 ENCOUNTER — Inpatient Hospital Stay: Payer: PRIVATE HEALTH INSURANCE | Attending: Genetic Counselor | Admitting: Genetic Counselor

## 2022-02-22 ENCOUNTER — Inpatient Hospital Stay: Payer: PRIVATE HEALTH INSURANCE

## 2022-02-22 DIAGNOSIS — Z803 Family history of malignant neoplasm of breast: Secondary | ICD-10-CM | POA: Diagnosis not present

## 2022-02-22 DIAGNOSIS — Z800 Family history of malignant neoplasm of digestive organs: Secondary | ICD-10-CM

## 2022-02-22 LAB — GENETIC SCREENING ORDER

## 2022-02-22 NOTE — Progress Notes (Signed)
REFERRING PROVIDER: McDiarmid, Blane Ohara, MD [place],  [place] [id number]  PRIMARY PROVIDER:  Eppie Gibson, MD  PRIMARY REASON FOR VISIT:  Encounter Diagnoses  Name Primary?   Family history of breast cancer Yes   Family history of colon cancer      HISTORY OF PRESENT ILLNESS:   Erika Decker, a 36 y.Decker. female, was seen for a [facility] cancer genetics consultation at the request of Dr. McDiarmid due to a family history of breast cancer.  Erika Decker presents to clinic today to discuss the possibility of a hereditary predisposition to cancer, to discuss genetic testing, and to further clarify her future cancer risks, as well as potential cancer risks for family members.   Erika Decker is a 36 y.Decker. female with no personal history of cancer.     RISK FACTORS:  Mammogram within the last year: yes Number of breast biopsies: 0. Colonoscopy: no; not examined. Hysterectomy: no. Ovaries intact: yes.  Up to date with pelvic exams: PAP in 2022. Menarche was at age 40.  First live birth at age 72.  Menopausal status: premenopausal.  OCP use for approximately 1 years.  Any excessive radiation exposure in the past: no Dermatology screening: no  Past Medical History:  Diagnosis Date   Abnormal Pap smear 2011   Colpo > paps nml after   Abnormal uterine bleeding 02/20/2019   Dizziness    Lightheadedness 09/27/2016   Medical history non-contributory    Vaginal Pap smear, abnormal     Past Surgical History:  Procedure Laterality Date   COLPOSCOPY     TUBAL LIGATION Bilateral 03/19/2014   Procedure: ESSURE TUBAL STERILIZATION;  Surgeon: Lahoma Crocker, MD;  Location: [facility];  Service: Gynecology;  Laterality: Bilateral;     FAMILY HISTORY:  We obtained a detailed, 4-generation family history.  Significant diagnoses are listed below: Family History  Problem Relation Age of Onset   Lung cancer Mother 73       smoking hx   Breast cancer Mother 56   Breast cancer  Maternal Grandmother 36       metastatic   Colon cancer Maternal Grandfather 69     Erika Decker is unaware of previous family history of genetic testing for hereditary cancer risks. Her mother is unavailable for genetic testing at this point in time. There is no reported Ashkenazi Jewish ancestry. There is no known consanguinity.  GENETIC COUNSELING ASSESSMENT: Erika Decker is a 36 y.Decker. female with a family history which is somewhat suggestive of a hereditary cancer syndrome and predisposition to cancer given the presence of breast cancer in multiple generations at young ages. We, therefore, discussed and recommended the following at today's visit.   DISCUSSION: We discussed that 5 - 10% of cancer is hereditary, with most cases of hereditary breast cancer associated with mutations in BRCA1/2.  There are other genes that can be associated with hereditary breast cancer syndromes.  We discussed that testing is beneficial for several reasons, including knowing about other cancer risks, identifying potential screening and risk-reduction options that may be appropriate, and to understanding if other family members could be at risk for cancer and allowing them to undergo genetic testing.  We reviewed the characteristics, features and inheritance patterns of hereditary cancer syndromes. We also discussed genetic testing, including the appropriate family members to test, the process of testing, insurance coverage and turn-around-time for results. We discussed the implications of a negative, positive, carrier and/or variant of uncertain significant result.  We discussed that negative results would be uninformative given that Erika Decker does not have a personal history of cancer. We recommended Erika Decker pursue genetic testing for a panel that contains genes associated with breast and other cancers.  Erika Decker was offered a common hereditary cancer panel (47 genes) and an expanded pan-cancer panel (84 genes). Erika Decker  was informed of the benefits and limitations of each panel, including that expanded pan-cancer panels contain several genes that do not have clear management guideline s at this point in time.  We also discussed that as the number of genes included on a panel increases, the chances of variants of uncertain significance increases.  After considering the benefits and limitations of each gene panel, Erika Decker elected to have an expanded Radio broadcast assistant through [employer].  The Multi-Cancer + RNA Panel offered by [facility] includes sequencing and/or deletion/duplication analysis of the following 84 genes:  AIP*, ALK, APC*, ATM*, AXIN2*, BAP1*, BARD1*, BLM*, BMPR1A*, BRCA1*, BRCA2*, BRIP1*, CASR, CDC73*, CDH1*, CDK4, CDKN1B*, CDKN1C*, CDKN2A, CEBPA, CHEK2*, CTNNA1*, DICER1*, DIS3L2*, EGFR, EPCAM, FH*, FLCN*, GATA2*, GPC3, GREM1, HOXB13, HRAS, KIT, MAX*, MEN1*, MET, MITF, MLH1*, MSH2*, MSH3*, MSH6*, MUTYH*, NBN*, NF1*, NF2*, NTHL1*, PALB2*, PDGFRA, PHOX2B, PMS2*, POLD1*, POLE*, POT1*, PRKAR1A*, PTCH1*, PTEN*, RAD50*, RAD51C*, RAD51D*, RB1*, RECQL4, RET, RUNX1*, SDHA*, SDHAF2*, SDHB*, SDHC*, SDHD*, SMAD4*, SMARCA4*, SMARCB1*, SMARCE1*, STK11*, SUFU*, TERC, TERT, TMEM127*, Tp53*, TSC1*, TSC2*, VHL*, WRN*, and WT1.  RNA analysis is performed for * genes.   Based on Erika Decker's family history of breast cancer, she meets medical criteria for genetic testing. Despite that she meets criteria, she may still have an out of pocket cost. We discussed that if her out of pocket cost for testing is over $100, the laboratory should contact them to discuss self-pay options and/or patient pay assistance programs (PAP).  Erika Decker signed PAP application today.   We discussed that some people do not want to undergo genetic testing due to fear of genetic discrimination.  A federal law called the Genetic Information Non-Discrimination Act (GINA) of 2008 helps protect individuals against genetic discrimination based on their genetic  test results.  It impacts both health insurance and employment.  With health insurance, it protects against increased premiums, being kicked off insurance or being forced to take a test in order to be insured.  For employment it protects against hiring, firing and promoting decisions based on genetic test results.  GINA does not apply to those in the [employer], those who work for companies with less than 15 employees, and new life insurance or long-term disability insurance policies.  Health status due to a cancer diagnosis is not protected under GINA.  PLAN: After considering the risks, benefits, and limitations, Erika Decker provided informed consent to pursue genetic testing and the blood sample was sent to [facility] for analysis of the Multi-Cancer +RNA Panel. Results should be available within approximately 3 weeks' time, at which point they will be disclosed by telephone to Erika Decker, as will any additional recommendations warranted by these results. Erika Decker will receive a summary of her genetic counseling visit and a copy of her results once available. This information will also be available in Epic.   Based on Erika Decker's family history, we recommended her mother have genetic counseling and testing. Her mother lives in [place] was previously offered genetic testing and declined due to cost.  Erika Decker will let us know if we can be of any assistance coordinating genetic testing for her mother  or discussing availability of reduced cost or no charge genetic testing for her.   Ms. Ovens questions were answered to her satisfaction today. Our contact information was provided should additional questions or concerns arise. Thank you for the referral and allowing [place] to share in the care of your patient.   Lenda Baratta M. Joette Catching, [place], [facility] Genetic Counselor [email or web address] (P) [phone]   The patient was seen for a total of 30 minutes in face-to-face genetic counseling.  The  patient was seen alone.  Drs. Lindi Adie and/or Burr Medico were available to discuss this case as needed.  _______________________________________________________________________ For Office Staff:  Number of people involved in session: 1 Was an Intern/ student involved with case: no

## 2022-02-23 ENCOUNTER — Encounter: Payer: Self-pay | Admitting: Genetic Counselor

## 2022-02-23 DIAGNOSIS — Z803 Family history of malignant neoplasm of breast: Secondary | ICD-10-CM

## 2022-02-23 HISTORY — DX: Family history of malignant neoplasm of breast: Z80.3

## 2022-02-27 ENCOUNTER — Encounter: Payer: Self-pay | Admitting: Family Medicine

## 2022-02-27 ENCOUNTER — Ambulatory Visit: Payer: Self-pay

## 2022-02-27 ENCOUNTER — Ambulatory Visit (INDEPENDENT_AMBULATORY_CARE_PROVIDER_SITE_OTHER): Payer: PRIVATE HEALTH INSURANCE | Admitting: Family Medicine

## 2022-02-27 VITALS — BP 105/67 | Ht 61.0 in | Wt 148.0 lb

## 2022-02-27 DIAGNOSIS — M25512 Pain in left shoulder: Secondary | ICD-10-CM

## 2022-02-27 NOTE — Patient Instructions (Signed)
You have rotator cuff impingement Try to avoid painful activities (overhead activities, lifting with extended arm) as much as possible. Aleve 2 tabs twice a day with food OR ibuprofen 3 tabs three times a day with food for pain and inflammation as needed. Can take tylenol in addition to this. Subacromial injection may be beneficial to help with pain and to decrease inflammation. Consider physical therapy with transition to home exercise program. Do home exercise program with theraband and scapular stabilization exercises daily 3 sets of 10 once a day - these are very important for long term relief. Call me if you want do to  physical therapy or the nitro patches. If not improving at follow-up we will consider injection, physical therapy, and/or nitro patches. Follow up with me in 1 month to 6 weeks.

## 2022-02-27 NOTE — Progress Notes (Unsigned)
PCP: Alicia Amel, MD  Subjective:   HPI: Patient is a 36 y.o. female here for left shoulder pain.  Patient denies known injury or trauma. She has had about 1 month of left shoulder pain. Worse with reaching and at nighttime - hard to lie on this side. Done home exercises, icing, tylenol, glenohumeral injection without much benefit.  Past Medical History:  Diagnosis Date   Abnormal Pap smear 2011   Colpo > paps nml after   Abnormal uterine bleeding 02/20/2019   Dizziness    Family history of breast cancer 02/23/2022   Lightheadedness 09/27/2016   Medical history non-contributory    Vaginal Pap smear, abnormal     Current Outpatient Medications on File Prior to Visit  Medication Sig Dispense Refill   naproxen (NAPROSYN) 500 MG tablet Take 1 tablet (500 mg total) by mouth 2 (two) times daily with a meal. 30 tablet 0   No current facility-administered medications on file prior to visit.    Past Surgical History:  Procedure Laterality Date   COLPOSCOPY     TUBAL LIGATION Bilateral 03/19/2014   Procedure: ESSURE TUBAL STERILIZATION;  Surgeon: Antionette Char, MD;  Location: [facility];  Service: Gynecology;  Laterality: Bilateral;    No Known Allergies  BP 105/67   Ht 5\' 1"  (1.549 m)   Wt 148 lb (67.1 kg)   LMP 01/23/2022   BMI 27.96 kg/m      02/27/2022    3:35 PM  [facility] Adult [facility]  Frequency of aerobic exercise (# of days/week) 7  Average time in minutes 30  Frequency of strengthening activities (# of days/week) 7        No data to display             Objective:  Physical Exam:  Gen: NAD, comfortable in exam room  Left shoulder: No swelling, ecchymoses.  No gross deformity. No TTP AC joint, biceps tendon. FROM. Negative Hawkins, Neers. Negative Yergasons. Strength 5/5 with empty can and resisted internal/external rotation.  Pain empty can. NV intact distally.  Complete MSK u/s left shoulder: Biceps tendon: intact without  tenosynovitis Pec major tendon: intact Subscapularis: intact without tear AC joint: normal without arthropathy or effusion Infraspinatus: intact without tear Supraspinatus: intact without tear.  Mild subacromial bursitis Posterior glenohumeral joint: no effusion, paralabral cyst, posterior labral pathology  Impression: mild subacromial bursitis.  Otherwise normal left shoulder ultrasound.   Assessment & Plan:  1. Left shoulder pain - exam and ultrasound reassuring.  No evidence rotator cuff tear.  Only mild subacromial bursitis.  Discussed options.  Provided home exercise program.  Declined PT for now.  Aleve or ibuprofen if needed.  Consider subacromial injection, nitro patches if not improving.  F/u in 1 month to 6 weeks.

## 2022-02-28 ENCOUNTER — Ambulatory Visit: Payer: PRIVATE HEALTH INSURANCE | Admitting: Family Medicine

## 2022-03-13 ENCOUNTER — Telehealth: Payer: Self-pay | Admitting: Genetic Counselor

## 2022-03-13 ENCOUNTER — Ambulatory Visit: Payer: Self-pay | Admitting: Genetic Counselor

## 2022-03-13 ENCOUNTER — Encounter: Payer: Self-pay | Admitting: Genetic Counselor

## 2022-03-13 DIAGNOSIS — Z1379 Encounter for other screening for genetic and chromosomal anomalies: Secondary | ICD-10-CM | POA: Insufficient documentation

## 2022-03-13 DIAGNOSIS — Z803 Family history of malignant neoplasm of breast: Secondary | ICD-10-CM

## 2022-03-13 NOTE — Telephone Encounter (Signed)
Revealed negative genetics.  Recommended GT for her mother based on her breast cancer history. Discussed TC score of ~19%.   See detailed clinic note.

## 2022-03-14 ENCOUNTER — Encounter: Payer: Self-pay | Admitting: Student

## 2022-03-14 NOTE — Progress Notes (Signed)
HPI:   Ms. Piazza was previously seen in the [place] clinic due to a family history of breast cancer and concerns regarding a hereditary predisposition to cancer. Please refer to our prior cancer genetics clinic note for more information regarding our discussion, assessment and recommendations, at the time. Ms. Lonzo recent genetic test results were disclosed to her, as were recommendations warranted by these results. These results and recommendations are discussed in more detail below.  CANCER HISTORY:  Ms. Amy is a 36 y.o. female with no personal history of cancer.      FAMILY HISTORY:  We obtained a detailed, 4-generation family history.  Significant diagnoses are listed below:      Family History  Problem Relation Age of Onset   Lung cancer Mother 13        smoking hx   Breast cancer Mother 57   Breast cancer Maternal Grandmother 57        metastatic   Colon cancer Maternal Grandfather 30       Ms. Pinch is unaware of previous family history of genetic testing for hereditary cancer risks. Her mother is unavailable for genetic testing at this point in time. There is no reported Ashkenazi Jewish ancestry. There is no known consanguinity.  GENETIC TEST RESULTS:  The [facility] Multi-Cancer +RNA Panel found no pathogenic mutations.   The Multi-Cancer + RNA Panel offered by [facility] includes sequencing and/or deletion/duplication analysis of the following 84 genes:  AIP*, ALK, APC*, ATM*, AXIN2*, BAP1*, BARD1*, BLM*, BMPR1A*, BRCA1*, BRCA2*, BRIP1*, CASR, CDC73*, CDH1*, CDK4, CDKN1B*, CDKN1C*, CDKN2A, CEBPA, CHEK2*, CTNNA1*, DICER1*, DIS3L2*, EGFR, EPCAM, FH*, FLCN*, GATA2*, GPC3, GREM1, HOXB13, HRAS, KIT, MAX*, MEN1*, MET, MITF, MLH1*, MSH2*, MSH3*, MSH6*, MUTYH*, NBN*, NF1*, NF2*, NTHL1*, PALB2*, PDGFRA, PHOX2B, PMS2*, POLD1*, POLE*, POT1*, PRKAR1A*, PTCH1*, PTEN*, RAD50*, RAD51C*, RAD51D*, RB1*, RECQL4, RET, RUNX1*, SDHA*, SDHAF2*, SDHB*, SDHC*, SDHD*, SMAD4*, SMARCA4*,  SMARCB1*, SMARCE1*, STK11*, SUFU*, TERC, TERT, TMEM127*, Tp53*, TSC1*, TSC2*, VHL*, WRN*, and WT1.  RNA analysis is performed for * genes. .   The test report has been scanned into EPIC and is located under the Molecular Pathology section of the Results Review tab.  A portion of the result report is included below for reference. Genetic testing reported out on March 09, 2022.      Even though a pathogenic variant was not identified, possible explanations for the cancer in the family may include: There may be no hereditary risk for cancer in the family. The cancers in Ms. 4 family may be sporadic/familial or due to other genetic and environmental factors. There may be a gene mutation in one of these genes that current testing methods cannot detect but that chance is small. There could be another gene that has not yet been discovered, or that we have not yet tested, that is responsible for the cancer diagnoses in the family.  It is also possible there is a hereditary cause for the cancer in the family that Ms. Galambos did not inherit.   Therefore, it is important to remain in touch with cancer genetics in the future so that we can continue to offer Ms. Brickner the most up to date genetic testing.    ADDITIONAL GENETIC TESTING:  We discussed with Ms. Mermelstein that her genetic testing was fairly extensive.  If there are additional relevant genes identified to increase cancer risk that can be analyzed in the future, we would be happy to discuss and coordinate this testing at that time.  CANCER SCREENING RECOMMENDATIONS:  Ms. Roskelley test result is considered negative (normal).  This means that we have not identified a hereditary cause for her family history of breast cancer at this time.   An individual's cancer risk and medical management are not determined by genetic test results alone. Overall cancer risk assessment incorporates additional factors, including personal medical history, family  history, and any available genetic information that may result in a personalized plan for cancer prevention and surveillance. Therefore, it is recommended she continue to follow the cancer management and screening guidelines provided by her primary healthcare provider.  Ms. Okun estimated lifetime risk for breast cancer based on Tyrer-Cuzick risk model is 19.2%.  We discussed that this estimate is considered to be elevated compared to the general population but is not classified as 'high risk for breast cancer' at this time.  This risk estimate can change over time and may be repeated to reflect new information in her personal or family history in the future.  She should speak with her referring provider about appropriate breast screening.  We discussed that it is reasonable to have regular breast imaging 10 years before the youngest diagnosis in the family (around age 3 based on her current family history information).       RECOMMENDATIONS FOR FAMILY MEMBERS:   Since she did not inherit a identifiable mutation in a cancer predisposition gene included on this panel, her children could not have inherited a known mutation from her in one of these genes. Individuals in this family might be at some increased risk of developing cancer, over the general population risk, due to the family history of cancer.  Individuals in the family should notify their providers of the family history of cancer. We recommend women in this family have a yearly mammogram beginning at age 68, or 88 years younger than the earliest onset of cancer, an annual clinical breast exam, and perform monthly breast self-exams.  Other members of the family may still carry a pathogenic variant in one of these genes that Ms. Moman did not inherit. Based on the family history, we recommend her mother who was recently diagnosed with breast cancer have genetic counseling and testing. Ms. Gullickson will let us know if we can be of any assistance in  coordinating genetic counseling and/or testing for this family member.  She previously declined genetic testing at her treating institution due to out of pocket cost.  We discussed availability of patient pay assistance programs for reduced cost/no cost testing.    FOLLOW-UP:  Lastly, we discussed with Ms. Spackman that cancer genetics is a rapidly advancing field and it is possible that new genetic tests will be appropriate for her and/or her family members in the future. We encouraged her to remain in contact with cancer genetics on an annual basis so we can update her personal and family histories and let her know of advances in cancer genetics that may benefit this family.   Our contact number was provided. Ms. Ekdahl questions were answered to her satisfaction, and she knows she is welcome to call us at anytime with additional questions or concerns.   Iisha Soyars M. Joette Catching, [place], [facility] Genetic Counselor [email or web address] (P) [phone]

## 2022-03-20 ENCOUNTER — Ambulatory Visit (INDEPENDENT_AMBULATORY_CARE_PROVIDER_SITE_OTHER): Payer: PRIVATE HEALTH INSURANCE | Admitting: Family Medicine

## 2022-03-20 ENCOUNTER — Encounter: Payer: Self-pay | Admitting: Family Medicine

## 2022-03-20 VITALS — BP 113/64 | HR 91 | Ht 61.0 in | Wt 147.2 lb

## 2022-03-20 DIAGNOSIS — N6313 Unspecified lump in the right breast, lower outer quadrant: Secondary | ICD-10-CM

## 2022-03-20 DIAGNOSIS — N6314 Unspecified lump in the right breast, lower inner quadrant: Secondary | ICD-10-CM

## 2022-03-20 DIAGNOSIS — N63 Unspecified lump in unspecified breast: Secondary | ICD-10-CM

## 2022-03-20 NOTE — Patient Instructions (Signed)
It was great seeing you today!  Today we discussed your breast changes, we will get another ultrasound and mammogram on that right side.   Please follow up at your next scheduled appointment in 1 month, if anything arises between now and then, please don't hesitate to contact our office.   Thank you for allowing [place] to be a part of your medical care!  Thank you, Dr. Robyne Peers

## 2022-03-20 NOTE — Progress Notes (Signed)
    SUBJECTIVE:   CHIEF COMPLAINT / HPI:   Patient presents with breast concern. She was doing her routine breast exam and she felt a spot that was on her right side that felt different than the other parts. Noticed this 2 weeks ago. It is only painful when she touches it, otherwise it does not bother her. Family history of breast cancer, maternal grandmother passed away from breast cancer at 64 and diagnosed 35 years old. Mother is on her 3rd biopsy for ongoing breast changes. The patient has had genetics testing at [facility] which came back negative. The concerning area has not been growing at all since it has developed. Denies any erythema or edema. Denies any discharge, fever or other symptoms. She had a mammogram most recently a month ago and it showed no abnormalities. Has had multiple breast imaging in the past including ultrasound and mammogram which showed no malignancy. LMP 02/23/2022, sometimes gets changes with her menstrual periods and usually her breasts will be sore a week before her cycle but this feels different.   OBJECTIVE:   BP 113/64   Pulse 91   Ht 5\' 1"  (1.549 m)   Wt 147 lb 3.2 oz (66.8 kg)   LMP 02/23/2022   SpO2 100%   BMI 27.81 kg/m   General: Patient well-appearing, in no acute distress. Resp: normal work of breathing MSK: mild tenderness along palpation of lower quadrant of right breast approaching the axilla, noted to have about a 1 cm subcutaneous nodule noted in this area  Breast exam performed in the presence of chaperone, 02/25/2022, CMA.   ASSESSMENT/PLAN:   Breast lump in lower outer quadrant -concern for new changes in the setting of strong family history of breast cancer, likely cystic in nature but want to further investigate this to ensure it is benign. Ordered right ultrasound and right mammogram since patient recently had bilateral mammogram -follow up in 1 month    -PHQ-9 score of 0 reviewed.   Cleatrice Burke, DO [facility]

## 2022-03-21 ENCOUNTER — Encounter: Payer: Self-pay | Admitting: Student

## 2022-03-21 DIAGNOSIS — N63 Unspecified lump in unspecified breast: Secondary | ICD-10-CM | POA: Insufficient documentation

## 2022-03-21 NOTE — Assessment & Plan Note (Signed)
-  concern for new changes in the setting of strong family history of breast cancer, likely cystic in nature but want to further investigate this to ensure it is benign. Ordered right ultrasound and right mammogram since patient recently had bilateral mammogram -follow up in 1 month

## 2022-03-26 ENCOUNTER — Ambulatory Visit (INDEPENDENT_AMBULATORY_CARE_PROVIDER_SITE_OTHER): Payer: PRIVATE HEALTH INSURANCE | Admitting: Family Medicine

## 2022-03-26 ENCOUNTER — Encounter: Payer: Self-pay | Admitting: Family Medicine

## 2022-03-26 VITALS — BP 120/75 | Ht 61.0 in

## 2022-03-26 DIAGNOSIS — M25512 Pain in left shoulder: Secondary | ICD-10-CM

## 2022-03-26 NOTE — Progress Notes (Signed)
PCP: Alicia Amel, MD  Subjective:   HPI: Patient is a 36 y.o. female here for left shoulder pain.  8/1: Patient denies known injury or trauma. She has had about 1 month of left shoulder pain. Worse with reaching and at nighttime - hard to lie on this side. Done home exercises, icing, tylenol, glenohumeral injection without much benefit.  8/28: Patient reports she's doing much better. Some sharp fleeting pain if reaching far overhead into cabinets. No night pain. Not needing any medications. Doing home exercises.  Past Medical History:  Diagnosis Date   Abnormal Pap smear 2011   Colpo > paps nml after   Abnormal uterine bleeding 02/20/2019   Dizziness    Family history of breast cancer 02/23/2022   Lightheadedness 09/27/2016   Medical history non-contributory    Vaginal Pap smear, abnormal     Current Outpatient Medications on File Prior to Visit  Medication Sig Dispense Refill   naproxen (NAPROSYN) 500 MG tablet Take 1 tablet (500 mg total) by mouth 2 (two) times daily with a meal. 30 tablet 0   No current facility-administered medications on file prior to visit.    Past Surgical History:  Procedure Laterality Date   COLPOSCOPY     TUBAL LIGATION Bilateral 03/19/2014   Procedure: ESSURE TUBAL STERILIZATION;  Surgeon: Antionette Char, MD;  Location: [facility];  Service: Gynecology;  Laterality: Bilateral;    No Known Allergies  BP 120/75   Ht 5\' 1"  (1.549 m)   LMP 02/23/2022   BMI 27.81 kg/m      02/27/2022    3:35 PM  [facility] Adult [facility]  Frequency of aerobic exercise (# of days/week) 7  Average time in minutes 30  Frequency of strengthening activities (# of days/week) 7        No data to display             Objective:  Physical Exam:  Gen: NAD, comfortable in exam room  Left shoulder: No swelling, ecchymoses.  No gross deformity. No TTP. FROM. Negative Hawkins, Neers. Negative Yergasons. Strength 5/5 with empty can and  resisted internal/external rotation. NV intact distally.  Assessment & Plan:  1. Left shoulder pain - 2/2 subacromial bursitis, impingement.  Much improved.  Continue home exercises for 6 more weeks.  Ibuprofen or aleve if needed.  F/u prn.

## 2022-04-30 ENCOUNTER — Encounter: Payer: Self-pay | Admitting: Student

## 2022-05-17 ENCOUNTER — Encounter: Payer: Self-pay | Admitting: Student

## 2022-05-23 ENCOUNTER — Encounter: Payer: Self-pay | Admitting: Student

## 2022-05-23 DIAGNOSIS — R42 Dizziness and giddiness: Secondary | ICD-10-CM

## 2022-05-23 MED ORDER — MECLIZINE HCL 25 MG PO TABS: 25.0000 mg | ORAL_TABLET | Freq: Three times a day (TID) | ORAL | 0 refills | Status: DC | PRN

## 2023-02-15 ENCOUNTER — Encounter: Payer: PRIVATE HEALTH INSURANCE | Admitting: Student

## 2023-02-18 ENCOUNTER — Ambulatory Visit (INDEPENDENT_AMBULATORY_CARE_PROVIDER_SITE_OTHER): Payer: PRIVATE HEALTH INSURANCE | Admitting: Student

## 2023-02-18 ENCOUNTER — Encounter: Payer: Self-pay | Admitting: Student

## 2023-02-18 VITALS — BP 98/68 | HR 60 | Ht 61.0 in | Wt 152.2 lb

## 2023-02-18 DIAGNOSIS — E7841 Elevated Lipoprotein(a): Secondary | ICD-10-CM | POA: Diagnosis not present

## 2023-02-18 MED ORDER — ROSUVASTATIN CALCIUM 40 MG PO TABS: 40.0000 mg | ORAL_TABLET | Freq: Every day | ORAL | 3 refills | Status: DC
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
## 2023-02-18 NOTE — Patient Instructions (Addendum)
Erika Decker to see you!  Your Lp(a) is high. We need to do something about that. This is somewhat new territory for medicine. We are learning new things every day. Right now we know that the most important things we can do to reduce your risk of a heart attack or stroke are to 1) eat right (high fiber, no empty carbs, plenty of protein) 2) exercise (including resistance training with weights or weight machines or bands) and 3) driving your cholesterol down basically as low as we can get it. Therefore I am starting you on a medication call Crestor (rosuvastatin) 40mg  daily. Come back in 4-6 months for a recheck. If your Lp(a) and LDL are still high, we will add a medicine called Repatha. Unless we get a new medicine before then.   - Try the following to help you sleep better:  - limit naps during the day  - no screens (TV, phone, tablet, computer) at least 1-2 hours before bedtime.  - have a quiet and dark sleeping environment.  - no large meals or drinks about 1 hour before bed.  - Avoid taking diuretics (hydrochlorothiazide, furosemide) in the evenings.  - Exercise or move your body regularly every day.  - You can also try melatonin 10 mg over the counter. Take this 1-2 hours before bed. You can increase to 20mg  if this is not helpful. - If you are lying in bed for 30 mins-1 hour and aren't falling asleep, get out of bed and do something relaxing like reading (NO TV!) until you are tired.

## 2023-02-19 DIAGNOSIS — E7841 Elevated Lipoprotein(a): Secondary | ICD-10-CM | POA: Insufficient documentation

## 2023-02-19 NOTE — Assessment & Plan Note (Addendum)
Given marked elevation in Lp(a) and family history of early major cardiovascular disease, would favor starting therapy and starting aggressively. Will initiate statin therapy knowing that this may have minimal effect on Lp(a). Likely to go on to need addition of PCSK9i. 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 - Crestor 40mg  daily - Repeat lipid profile and Lp(a) in 4-6 months, if remains elevated, add PCSK9i at that time.

## 2023-03-15 ENCOUNTER — Encounter: Payer: Self-pay | Admitting: Student

## 2023-05-02 ENCOUNTER — Telehealth: Payer: PRIVATE HEALTH INSURANCE | Admitting: Physician Assistant

## 2023-05-02 DIAGNOSIS — N63 Unspecified lump in unspecified breast: Secondary | ICD-10-CM

## 2023-05-02 DIAGNOSIS — Z803 Family history of malignant neoplasm of breast: Secondary | ICD-10-CM

## 2023-05-02 DIAGNOSIS — N644 Mastodynia: Secondary | ICD-10-CM

## 2023-05-02 NOTE — Progress Notes (Signed)
Virtual Visit Consent   Erika Decker, you are scheduled for a virtual visit with a [facility] provider today. Just as with appointments in the office, your consent must be obtained to participate. Your consent will be active for this visit and any virtual visit you may have with one of our providers in the next 365 days. If you have a MyChart account, a copy of this consent can be sent to you electronically.  As this is a virtual visit, video technology does not allow for your provider to perform a traditional examination. This may limit your provider's ability to fully assess your condition. If your provider identifies any concerns that need to be evaluated in person or the need to arrange testing (such as labs, EKG, etc.), we will make arrangements to do so. Although advances in technology are sophisticated, we cannot ensure that it will always work on either your end or our end. If the connection with a video visit is poor, the visit may have to be switched to a telephone visit. With either a video or telephone visit, we are not always able to ensure that we have a secure connection.  By engaging in this virtual visit, you consent to the provision of healthcare and authorize for your insurance to be billed (if applicable) for the services provided during this visit. Depending on your insurance coverage, you may receive a charge related to this service.  I need to obtain your verbal consent now. Are you willing to proceed with your visit today? Erika Decker has provided verbal consent on 05/02/2023 for a virtual visit (video or telephone). Margaretann Loveless, PA-C  Date: 05/02/2023 1:41 PM  Virtual Visit via Video Note   I, Margaretann Loveless, connected with  Erika Decker  ([id number], Jun 30, 1986) on 05/02/23 at  1:30 PM EDT by a video-enabled telemedicine application and verified that I am speaking with the correct person using two identifiers.  Location: Patient: Virtual Visit Location  Patient: Mobile Provider: Virtual Visit Location Provider: Home Office   I discussed the limitations of evaluation and management by telemedicine and the availability of in person appointments. The patient expressed understanding and agreed to proceed.    History of Present Illness: Erika Decker is a 37 y.o. who identifies as a female who was assigned female at birth, and is being seen today for breast tenderness. Has been having pain and tenderness in her breast around the nipples over the last 2-3 weeks. Has an area on the right breast that has been followed with [place] and mammogram. Most recent Mammogram was 01/20/20. Had 2 US of the right breast last year in July and Sept 2023. Both showed an uncomplicated cyst in the area of concern. She reports that area now feels larger and more firm. Has a new area in the left breast. It is small, like how the right breast started. Both areas are tender. No abnormal nipple discharge. No overlying skin changes. Both areas do become tender around the time she has her menstrual cycle. This time they did not improve after her menstrual like they normally do.  Does have breast cancer in MGM passed at 30, mother had breast cancer last year.    Problems:  Patient Active Problem List   Diagnosis Date Noted   Elevated Lp(a) 02/19/2023   Genetic testing 03/13/2022   Family history of breast cancer 02/23/2022   Rotator cuff arthropathy of left shoulder 01/15/2022   Annual physical exam 03/03/2020  Fibrocystic breast changes of both breasts 12/17/2018    Allergies: No Known Allergies Medications:  Current Outpatient Medications:    meclizine (ANTIVERT) 25 MG tablet, Take 1 tablet (25 mg total) by mouth 3 (three) times daily as needed for dizziness. (Patient not taking: Reported on 02/18/2023), Disp: 30 tablet, Rfl: 0   naproxen (NAPROSYN) 500 MG tablet, Take 1 tablet (500 mg total) by mouth 2 (two) times daily with a meal. (Patient not taking: Reported on  02/18/2023), Disp: 30 tablet, Rfl: 0   rosuvastatin (CRESTOR) 40 MG tablet, Take 1 tablet (40 mg total) by mouth daily., Disp: 90 tablet, Rfl: 3  Observations/Objective: Patient is well-developed, well-nourished in no acute distress.  Resting comfortably  Head is normocephalic, atraumatic.  No labored breathing.  Speech is clear and coherent with logical content.  Patient is alert and oriented at baseline.    Assessment and Plan: 1. Breast tenderness in female  2. Mass of breast, unspecified laterality  3. Family history of breast cancer  - Discussed due to her history this may be best evaluated in person for a hands on breast exam and considerations for any imaging that may be required; voiced understanding and agreeable to plan  Follow Up Instructions: I discussed the assessment and treatment plan with the patient. The patient was provided an opportunity to ask questions and all were answered. The patient agreed with the plan and demonstrated an understanding of the instructions.  A copy of instructions were sent to the patient via MyChart unless otherwise noted below.    The patient was advised to call back or seek an in-person evaluation if the symptoms worsen or if the condition fails to improve as anticipated.  Margaretann Loveless, PA-C

## 2023-05-02 NOTE — Patient Instructions (Signed)
Erika Decker, thank you for joining Erika Loveless, PA-C for today's virtual visit.  While this provider is not your primary care provider (PCP), if your PCP is located in our provider database this encounter information will be shared with them immediately following your visit.   A [facility] MyChart account gives you access to today's visit and all your visits, tests, and labs performed at [facility] " click here if you don't have a [facility] MyChart account or go to [email or web address]  Consent: (Patient) Erika Decker provided verbal consent for this virtual visit at the beginning of the encounter.  Current Medications:  Current Outpatient Medications:    meclizine (ANTIVERT) 25 MG tablet, Take 1 tablet (25 mg total) by mouth 3 (three) times daily as needed for dizziness. (Patient not taking: Reported on 02/18/2023), Disp: 30 tablet, Rfl: 0   naproxen (NAPROSYN) 500 MG tablet, Take 1 tablet (500 mg total) by mouth 2 (two) times daily with a meal. (Patient not taking: Reported on 02/18/2023), Disp: 30 tablet, Rfl: 0   rosuvastatin (CRESTOR) 40 MG tablet, Take 1 tablet (40 mg total) by mouth daily., Disp: 90 tablet, Rfl: 3   Medications ordered in this encounter:  No orders of the defined types were placed in this encounter.    *If you need refills on other medications prior to your next appointment, please contact your pharmacy*  Follow-Up: Call back or seek an in-person evaluation if the symptoms worsen or if the condition fails to improve as anticipated.   Virtual Care [phone]  Other Instructions Breast Cyst  A breast cyst is a sac in the breast that is filled with fluid. These cysts are usually noncancerous (benign). Breast cysts are most often in the upper, outer portion of the breast. One or more cysts may develop. They form when fluid builds up inside the breast glands. There are several types of breast cysts. Some are too small to  feel but can be seen with imaging tests, such as an X-ray of the breast (mammogram) or ultrasound. Breast cysts do not increase your risk of breast cancer. They usually disappear after you no longer have a menstrual cycle (after menopause), unless you take artificial hormones (are on hormone replacement therapy or menopausal hormone therapy). What are the causes? This condition may be caused by: Blockage of tubes (ducts) in the breast glands, which leads to fluid buildup. Duct blockage may result from: Fibrocystic breast changes. This is a common, benign condition that occurs when women go through hormonal changes during the menstrual cycle. This is a common cause of multiple breast cysts. Overgrowth of breast tissue or breast glands. Scar tissue in the breast from previous surgery. Changes in certain female hormones (estrogen and progesterone). The exact cause of this condition is not known. What increases the risk? You may be more likely to develop breast cysts from 36 years of age until the time of menopause. What are the signs or symptoms? Symptoms of this condition include: Feeling one or more smooth, round, soft lumps (like grapes) in the breast that are easily movable. The lump or lumps may get bigger and more painful before your menstrual period and get smaller after your menstrual period. They may also be painless. Breast discomfort or pain. How is this diagnosed? This condition may be diagnosed based on: A physical exam. Depending on the size of the cyst, your health care provider may be able to feel it during this exam. Imaging tests, such  as a mammogram or ultrasound. Fluid may be removed from the cyst with a needle (fine-needle aspiration) and tested to make sure the cyst is not cancerous. How is this treated? Treatment may not be needed for this condition. Your health care provider may monitor the cyst to see if it goes away on its own. If the cyst is uncomfortable or gets bigger,  or if you do not like how the cyst makes your breast look, you may need treatment. Treatment may include: Hormone therapy. Fine-needle aspiration to drain fluid from the cyst. There is a chance of the cyst coming back (recurring) after aspiration. Surgery to remove the cyst. Follow these instructions at home: Self-exams  Talk to your health care provider about breast self-exams. If recommended, do the exams as told. A breast self-exam involves: Comparing your breasts in the mirror. Looking for visible changes in your skin or nipples. Feeling for lumps or changes. Having many breast cysts may make it harder to feel for new lumps. Understand how your breasts normally look and feel, and write down any changes in your breasts. Tell your health care provider about any changes. Eating and drinking Follow instructions from your health care provider about what you may eat and drink. Drink enough fluid to keep your urine pale yellow. Avoid caffeine. Cut down on salt (sodium) in what you eat and drink, especially before your menstrual period. Too much sodium can cause fluid buildup, breast swelling, and discomfort. General instructions See your health care provider regularly. Get a yearly physical exam. If you are 36-17 years old, get a clinical breast exam every 1-3 years. After the age of 40 years, get this exam every year. Get mammograms as often as directed. Take over-the-counter and prescription medicines only as told by your health care provider. Wear a supportive bra, especially when exercising. Contact a health care provider if: You feel, or think you feel, a lump in your breast. You notice that both breasts look or feel different than usual. Your breast is still causing pain after your menstrual period is over. You find new lumps or bumps that were not there before. You feel lumps in your armpit. Get help right away if: You have severe pain, tenderness, redness, or warmth in your  breast. You have fluid or blood leaking from your nipple. Your breast lump becomes hard and painful. You notice dimpling or wrinkling of the breast or nipple. Summary A breast cyst is a sac in the breast that is filled with fluid. Treatment may not be needed for this condition. If the cyst is uncomfortable or gets bigger, or if you do not like how the cyst makes your breast look, you may need treatment. This information is not intended to replace advice given to you by your health care provider. Make sure you discuss any questions you have with your health care provider. Document Revised: 09/09/2021 Document Reviewed: 09/09/2021 Elsevier Patient Education  2024 Elsevier Inc.    If you have been instructed to have an in-person evaluation today at a local Urgent Care facility, please use the link below. It will take you to a list of all of our available [facility], including address, phone number and hours of operation. Please do not delay care.  [facility]  If you or a family member do not have a primary care provider, use the link below to schedule a visit and establish care. When you choose a [facility] primary care physician or advanced  practice provider, you gain a long-term partner in health. Find a Primary Care Provider  Learn more about [facility] in-office and virtual care options: [facility] - Get Care Now

## 2023-05-09 ENCOUNTER — Other Ambulatory Visit: Payer: Self-pay

## 2023-05-09 ENCOUNTER — Ambulatory Visit: Payer: PRIVATE HEALTH INSURANCE | Admitting: Student

## 2023-05-09 ENCOUNTER — Encounter: Payer: Self-pay | Admitting: Student

## 2023-05-09 VITALS — BP 117/72 | HR 80 | Ht 61.0 in | Wt 150.2 lb

## 2023-05-09 DIAGNOSIS — N644 Mastodynia: Secondary | ICD-10-CM | POA: Diagnosis not present

## 2023-05-09 DIAGNOSIS — N6011 Diffuse cystic mastopathy of right breast: Secondary | ICD-10-CM | POA: Diagnosis not present

## 2023-05-09 DIAGNOSIS — N6012 Diffuse cystic mastopathy of left breast: Secondary | ICD-10-CM

## 2023-05-09 NOTE — Assessment & Plan Note (Addendum)
Most concerning here is the rapid progression in size of her cysts that does not seem to correlate to her menses any longer. On her last imaging, the cyst in her R breast was 1.1x1.2cm, it is now >4x2.4cm. It has benign features on exam and point-of-care imaging, but given her family history of breast cancer, I think further imaging at the Breast Center is warranted. - [place] and mammography to be done at [facility] as this is the local center that accepts her insurance

## 2023-05-09 NOTE — Progress Notes (Signed)
    SUBJECTIVE:   CHIEF COMPLAINT / HPI:   Breast Heaviness Here today with a sense of heaviness and enlarging cystic changes in her right breast.  She has a documented history of fibrocystic changes in her breast confirmed on ultrasound of the [facility] center about a year ago.  She does note that her cyst typically get bigger around her period and she has predictable, cyclic breast tenderness, but she feels like the size and tenderness never went down after her cycle on Sept 15th. She has Fallopian tube occlusion with Essure implant so denies possibility of pregnancy.  She does have a strong family history of breast cancer with her grandmother passing away in her mid 43s of breast cancer.  Her mother was also recently diagnosed with breast cancer.  She was seen by genetics last year and had negative genetic testing.  Based on the Tyrer-Cuzick risk moder, her lifetime risk of Breast CA is 19.2%.    OBJECTIVE:   BP 117/72   Pulse 80   Ht 5\' 1"  (1.549 m)   Wt 150 lb 3.2 oz (68.1 kg)   LMP 04/14/2023   SpO2 100%   BMI 28.38 kg/m   Physical Exam Vitals reviewed.  Constitutional:      General: She is not in acute distress. Chest:  Breasts:    Right: Mass (One relatively large, cystic mass with regular borders is identified in the 7-9 oclock position, it is non-tender. There is a smaller ~1cm cystic mass infero-lateral to the larger lesion) present.     Left: Mass (small, ~1cm cystic mass at the 7 o clock position) present.       Comments: On POCUS, a large cystic lesion without intracystic echoes or debris is identified, no evidence of capsular vascularity on doppler. The lesion measures ~4cm in long axis (though I cannot visualize the entire lesion in the window of the probe) x 2.44cm in short axis.   There is a smaller cystic lesion that measures 0.9x1.1cm.      ASSESSMENT/PLAN:   Fibrocystic breast changes of both breasts Most concerning here is the rapid progression in  size of her cysts that does not seem to correlate to her menses any longer. On her last imaging, the cyst in her R breast was 1.1x1.2cm, it is now >4x2.4cm. It has benign features on exam and point-of-care imaging, but given her family history of breast cancer, I think further imaging at the Breast Center is warranted. - [place] and mammography to be done at [facility] as this is the local center that accepts her insurance      J Dorothyann Gibbs, MD [facility]

## 2023-05-09 NOTE — Progress Notes (Deleted)
Sept 15

## 2023-05-09 NOTE — Patient Instructions (Addendum)
I want you to go back to the breast center at [facility]. I have placed orders. You should be able to call them for an appointment. Their number is [phone].  I am referring you to Dr. Lavona Mound Tobb's African American Health Study which looks at Lp(a) in African American Women.  Eliezer Mccoy, MD

## 2023-05-13 ENCOUNTER — Encounter: Payer: Self-pay | Admitting: *Deleted

## 2023-05-13 ENCOUNTER — Other Ambulatory Visit: Payer: Self-pay | Admitting: Student

## 2023-05-13 DIAGNOSIS — Z803 Family history of malignant neoplasm of breast: Secondary | ICD-10-CM

## 2023-05-13 DIAGNOSIS — N6012 Diffuse cystic mastopathy of left breast: Secondary | ICD-10-CM

## 2023-05-13 NOTE — Progress Notes (Signed)
Order placed for bilateral diagnotic mammogram instead of R unilateral mammogram as requested. See note from 05/09/23 with Dr. Marisue Humble for details.

## 2023-05-24 ENCOUNTER — Other Ambulatory Visit: Payer: Self-pay | Admitting: Student

## 2023-05-24 DIAGNOSIS — N6001 Solitary cyst of right breast: Secondary | ICD-10-CM

## 2023-05-27 ENCOUNTER — Encounter: Payer: Self-pay | Admitting: Student

## 2023-09-30 ENCOUNTER — Ambulatory Visit (INDEPENDENT_AMBULATORY_CARE_PROVIDER_SITE_OTHER): Payer: PRIVATE HEALTH INSURANCE | Admitting: Student

## 2023-09-30 VITALS — BP 114/76 | HR 88 | Ht 61.0 in | Wt 147.2 lb

## 2023-09-30 DIAGNOSIS — U071 COVID-19: Secondary | ICD-10-CM

## 2023-09-30 NOTE — Patient Instructions (Signed)
 Pleasure to meet you today.  Glad to hear you are doing better today.  I recommend making sure you stay hydrated with enough water.  Also you can do Tylenol or ibuprofen for any aches and pain with the cough.  Encourage use of warm water, honey and lemon to help with the cough.  Continue flonase to help with the runny nose.

## 2023-09-30 NOTE — Progress Notes (Signed)
    SUBJECTIVE:   CHIEF COMPLAINT / HPI:   38 year old female presenting today for COVID follow-up.  She was recently at urgent care where she tested positive for COVID in February 18.  At the time patient states she had lightheadedness, sore throat, increased appetite and fatigue.  Denies any fever or chills and no known sick contacts at the time.  Sent home on conservative management and didn't receive anti-COVID med.  Seen via video visit is few days afterwards and started on antibiotics due to concerns of sinusitis.  Unsure of antibiotics patient was started on.  Today patient states she is doing much better has remained afebrile close to return of baseline however still endorses dry cough and runny nose.  Has been using Flonase for her runny nose.   PERTINENT  PMH / PSH: Reviewed.  OBJECTIVE:   BP 114/76   Pulse 88   Ht 5\' 1"  (1.549 m)   Wt 147 lb 3.2 oz (66.8 kg)   LMP 09/24/2023   SpO2 100%   BMI 27.81 kg/m    Physical Exam General: Alert, well appearing, NAD HEENT: Normal TM bilaterally, no oral or pharyngeal erythema or tonsillar exudate Cardiovascular: RRR, No Murmurs, Normal S2/S2 Respiratory: CTAB, No wheezing or Rales Abdomen: No distension or tenderness Extremities: No edema on extremities     ASSESSMENT/PLAN:   COVID infection Patient symptoms appears to have significantly improved.  Vitals and physical exam today generally reassuring.  Does appear to still have postviral cough.  Discussed sedative management including use of warm water, honey and lemon for the cough.  Return precautions discussed with patient who verbalized understanding and agreeable to plan.  Jerre Simon, MD [facility]

## 2024-01-07 ENCOUNTER — Encounter: Payer: Self-pay | Admitting: *Deleted

## 2024-02-20 ENCOUNTER — Other Ambulatory Visit (HOSPITAL_COMMUNITY)
Admission: RE | Admit: 2024-02-20 | Discharge: 2024-02-20 | Disposition: A | Discharge status: Home / Self Care | Payer: PRIVATE HEALTH INSURANCE | Source: Ambulatory Visit | Attending: Family Medicine | Admitting: Family Medicine

## 2024-02-20 ENCOUNTER — Ambulatory Visit: Payer: PRIVATE HEALTH INSURANCE

## 2024-02-20 VITALS — BP 99/65 | HR 59 | Ht 61.0 in | Wt 147.0 lb

## 2024-02-20 DIAGNOSIS — Z9851 Tubal ligation status: Secondary | ICD-10-CM

## 2024-02-20 DIAGNOSIS — Z0000 Encounter for general adult medical examination without abnormal findings: Secondary | ICD-10-CM | POA: Diagnosis present

## 2024-02-20 DIAGNOSIS — Z23 Encounter for immunization: Secondary | ICD-10-CM

## 2024-02-20 DIAGNOSIS — E782 Mixed hyperlipidemia: Secondary | ICD-10-CM

## 2024-02-20 DIAGNOSIS — J302 Other seasonal allergic rhinitis: Secondary | ICD-10-CM | POA: Insufficient documentation

## 2024-02-20 DIAGNOSIS — R7303 Prediabetes: Secondary | ICD-10-CM

## 2024-02-20 LAB — POCT GLYCOSYLATED HEMOGLOBIN (HGB A1C): HbA1c, POC (prediabetic range): 6 % (ref 5.7–6.4)

## 2024-02-20 NOTE — Patient Instructions (Addendum)
 Dear Erika Decker,  Today at your annual preventive visit we talked about the following measures: - You had a pap smear done today; the results of this will be available in your MyChart in a week or so. I will call you if there's anything abnormal. If your pap is negative, we can space out to one every 5 years. - You got your Tdap booster today - Your A1c, a sugar level, was in the range of prediabetes. You can take Vitamin D  for this over the counter if you would like, and make sure to keep regularly exercising and eating a wide variety of foods with limited sugars and starches like white breads. I recommend 150 minutes of exercise per week-try 30 minutes 5 days per week - We are checking your lipid panel to see how your cholesterol is doing on your statin; the results will be available in MyChart and I will call you if there is anything abnormal - We discussed avoiding tobacco and alcohol.  - I recommend avoiding illicit substances.  - Your blood pressure is 99/65 at goal of <120/80. Great job with this!    Thank you for allowing me to be a part of your care! Alan Flies, MD

## 2024-02-20 NOTE — Assessment & Plan Note (Addendum)
 On Crestor  40 mg daily. Last LDL of 150 done 12/2021. - Lipid panel to monitor today - A1c: 6.0

## 2024-02-20 NOTE — Assessment & Plan Note (Signed)
-   Pap + HPV testing performed + STI added on (per patient request) - Tdap given

## 2024-02-20 NOTE — Progress Notes (Signed)
    SUBJECTIVE:   Chief compliant/HPI: annual examination  Erika Decker is a 38 y.o. who presents today for an annual exam.   Review of systems form notable for N/A; patient has no concerns today.  Updated history tabs and problem list.   PMHx of: HLD, seasonal allergies, hx tubal ligation  FHx significant for: Breast cancer in grandmother, mother  Social Hx Alcohol use: None Tobacco use: None (secondhand smoke from husband) Substance use: None  OBJECTIVE:   BP 99/65   Pulse (!) 59   Ht 5' 1 (1.549 m)   Wt 147 lb (66.7 kg)   LMP 02/14/2024   SpO2 100%   BMI 27.78 kg/m   General: Patient seated in chair, no acute distress. HEENT: Sclera nonicteric, EOMI bilaterally.  Cardiovascular: Borderline bradycardic, regular rhythm, no murmurs/rubs/gallops. Respiratory: Normal work of breathing on room air. Clear to auscultation bilaterally; no wheezes, crackles. Abdomen: Bowel sounds present and normoactive bilaterally. Soft, nondistended, nontender. Extremities: Skin warm, dry. No bilateral lower extremity edema. Grossly normal strength and sensation. Neuro: Alert and appropriately responding to questions. Psych: Appropriate affect. Cervical exam: Performed with chaperone Custer, RN). External vagina without any lesions; cervix bleeding from os, patient currently on period. Non erythematous, nontender. Pap performed.  ASSESSMENT/PLAN:   Assessment & Plan Annual physical exam Encounter for immunization - Pap + HPV testing performed + STI added on (per patient request) - Tdap given Mixed hyperlipidemia On Crestor  40 mg daily. Last LDL of 150 done 12/2021. - Lipid panel to monitor today - A1c: 6.0 Prediabetes A1c of 6.0 today. Discussed lifestyle modifications, OTC Vitamin D  with patient. - Continue to monitor A1c  Annual Examination  See AVS for age appropriate recommendations.   PHQ score 0, reviewed and discussed. Blood pressure reviewed and at goal.  Asked about  intimate partner violence and patient reports none.  The patient has undergone a bilateral tubal ligation for contraception.  Advanced directives: None, not discussed this visit.   Considered the following items based upon USPSTF recommendations: HIV testing: discussed Last 08/2013 Nonreactive Hepatitis C: discussed Hep C Antibody 02/2020: Negative Hepatitis B: discussed Hep B surface antibody 01/2021: Immune Syphilis if at high risk: Not high risk GC/CT Added on to Pap as above Lipid panel (nonfasting or fasting) discussed based upon AHA recommendations and ordered. Repeat every year while on statin. Reviewed risk factors for latent tuberculosis and not indicated  Discussed family history, mom with breast cancer, grandma passed from breast cancer. Prior BRCA testing negative. Getting yearly mammogram, continue.  Cervical cancer screening: due for Pap today, cytology + HPV ordered Last pap in 01/2021; negative HPV, non concerning cytology; per prior note by Dr. Marlee, recommending repeat pap 01/2024 with history of abnormal pap in 2017. Per discussion with patient, would prefer to proceed with pap today. If negative, can space to every 5 years. Immunizations: Tdap booster given today MyChart Activation:Already signed up   Follow up in 1 year or sooner if indicated.    Alan Flies, MD [facility]

## 2024-02-21 ENCOUNTER — Ambulatory Visit: Payer: Self-pay

## 2024-02-21 DIAGNOSIS — E7841 Elevated Lipoprotein(a): Secondary | ICD-10-CM

## 2024-02-21 LAB — LIPID PANEL
Chol/HDL Ratio: 2.6 ratio (ref 0.0–4.4)
Cholesterol, Total: 128 mg/dL (ref 100–199)
HDL: 50 mg/dL (ref >39)
LDL Chol Calc (NIH): 67 mg/dL (ref 0–99)
Triglycerides: 45 mg/dL (ref 0–149)
VLDL Cholesterol Cal: 11 mg/dL (ref 5–40)

## 2024-02-28 LAB — CYTOLOGY - PAP
Chlamydia: NEGATIVE
Comment: NEGATIVE
Comment: NEGATIVE
Comment: NEGATIVE
Comment: NORMAL
Diagnosis: UNDETERMINED — AB
High risk HPV: NEGATIVE
Neisseria Gonorrhea: NEGATIVE
Trichomonas: NEGATIVE

## 2024-03-02 NOTE — Progress Notes (Signed)
 Called patient regarding results from Pap smear showing ASCUS.  Informed patient that given her many prior normal Pap smears and negative HPV on this Pap smear, there was no indication for further testing (colposcopy) at this time.  Will plan to do repeat Pap in 3 years.  Patient also had a question about vaginal discharge; reports she normally has a lot of clear and white discharge.  Reassured patient that this is normal, and gave warning signs of potential infection such as foul odor or yellow/green color.

## 2024-03-19 MED ORDER — ROSUVASTATIN CALCIUM 40 MG PO TABS: 40.0000 mg | ORAL_TABLET | Freq: Every day | ORAL | 3 refills | Status: AC
